# Patient Record
Sex: Female | Born: 1965 | Hispanic: No | Marital: Single | Smoking: Never smoker
Health system: Southern US, Community
[De-identification: ages and names within clinical notes are randomized; demographics above are authoritative.]

## PROBLEM LIST (undated history)

## (undated) DIAGNOSIS — G4730 Sleep apnea, unspecified: Secondary | ICD-10-CM

## (undated) DIAGNOSIS — G43909 Migraine, unspecified, not intractable, without status migrainosus: Secondary | ICD-10-CM

## (undated) DIAGNOSIS — N3281 Overactive bladder: Secondary | ICD-10-CM

## (undated) DIAGNOSIS — J449 Chronic obstructive pulmonary disease, unspecified: Secondary | ICD-10-CM

## (undated) DIAGNOSIS — J45909 Unspecified asthma, uncomplicated: Secondary | ICD-10-CM

## (undated) HISTORY — DX: Overactive bladder: N32.81

## (undated) HISTORY — DX: Morbid (severe) obesity due to excess calories: E66.01

## (undated) HISTORY — PX: ABDOMINAL HYSTERECTOMY: SHX81

---

## 2009-06-10 ENCOUNTER — Inpatient Hospital Stay: Payer: Self-pay | Admitting: Internal Medicine

## 2009-07-07 ENCOUNTER — Inpatient Hospital Stay: Payer: Self-pay | Admitting: Internal Medicine

## 2010-01-23 ENCOUNTER — Emergency Department (HOSPITAL_COMMUNITY): Admission: EM | Admit: 2010-01-23 | Discharge: 2010-01-23 | Payer: Self-pay | Admitting: Emergency Medicine

## 2011-08-02 ENCOUNTER — Emergency Department: Payer: Self-pay | Admitting: *Deleted

## 2012-02-15 ENCOUNTER — Emergency Department: Payer: Self-pay | Admitting: Emergency Medicine

## 2012-02-15 LAB — URINALYSIS, COMPLETE
Bilirubin,UR: NEGATIVE
Blood: NEGATIVE
Glucose,UR: NEGATIVE mg/dL (ref 0–75)
Ketone: NEGATIVE
Nitrite: NEGATIVE
Ph: 6 (ref 4.5–8.0)
RBC,UR: 1 /HPF (ref 0–5)
Specific Gravity: 1.018 (ref 1.003–1.030)
Squamous Epithelial: 11

## 2013-03-11 ENCOUNTER — Emergency Department: Payer: Self-pay | Admitting: Emergency Medicine

## 2013-03-11 LAB — URINALYSIS, COMPLETE
Bilirubin,UR: NEGATIVE
Glucose,UR: NEGATIVE mg/dL (ref 0–75)
Ketone: NEGATIVE
Ph: 6 (ref 4.5–8.0)
RBC,UR: 20 /HPF (ref 0–5)
Specific Gravity: 1.018 (ref 1.003–1.030)
WBC UR: 272 /HPF (ref 0–5)

## 2013-04-02 ENCOUNTER — Emergency Department: Payer: Self-pay

## 2013-04-02 LAB — COMPREHENSIVE METABOLIC PANEL
Alkaline Phosphatase: 83 U/L (ref 50–136)
Bilirubin,Total: 1.3 mg/dL — ABNORMAL HIGH (ref 0.2–1.0)
Calcium, Total: 8.7 mg/dL (ref 8.5–10.1)
Chloride: 106 mmol/L (ref 98–107)
EGFR (African American): >60
Glucose: 98 mg/dL (ref 65–99)
Osmolality: 274 (ref 275–301)
Potassium: 3.7 mmol/L (ref 3.5–5.1)
SGOT(AST): 30 U/L (ref 15–37)
SGPT (ALT): 37 U/L (ref 12–78)
Total Protein: 7.9 g/dL (ref 6.4–8.2)

## 2013-04-02 LAB — TROPONIN I: Troponin-I: <0.02 ng/mL

## 2013-04-02 LAB — CBC
HGB: 14.9 g/dL (ref 12.0–16.0)
MCH: 29 pg (ref 26.0–34.0)
MCHC: 33.4 g/dL (ref 32.0–36.0)
MCV: 87 fL (ref 80–100)
Platelet: 214 10*3/uL (ref 150–440)
RBC: 5.13 10*6/uL (ref 3.80–5.20)
RDW: 14.7 % — ABNORMAL HIGH (ref 11.5–14.5)
WBC: 10.9 10*3/uL (ref 3.6–11.0)

## 2013-05-16 ENCOUNTER — Emergency Department: Payer: Self-pay | Admitting: Emergency Medicine

## 2013-05-16 LAB — URIC ACID: Uric Acid: 4.7 mg/dL (ref 2.6–6.0)

## 2013-05-16 LAB — CBC WITH DIFFERENTIAL/PLATELET
Basophil #: 0.1 10*3/uL (ref 0.0–0.1)
Eosinophil #: 0.5 10*3/uL (ref 0.0–0.7)
Eosinophil %: 4.5 %
HCT: 44 % (ref 35.0–47.0)
HGB: 14.1 g/dL (ref 12.0–16.0)
Lymphocyte #: 2.9 10*3/uL (ref 1.0–3.6)
Lymphocyte %: 28.1 %
MCH: 28 pg (ref 26.0–34.0)
Monocyte #: 0.5 x10 3/mm (ref 0.2–0.9)
Neutrophil #: 6.2 10*3/uL (ref 1.4–6.5)
Neutrophil %: 61.1 %
RDW: 14.3 % (ref 11.5–14.5)
WBC: 10.2 10*3/uL (ref 3.6–11.0)

## 2013-05-16 LAB — BASIC METABOLIC PANEL
Anion Gap: 9 (ref 7–16)
Calcium, Total: 8.8 mg/dL (ref 8.5–10.1)
Creatinine: 0.59 mg/dL — ABNORMAL LOW (ref 0.60–1.30)
EGFR (African American): >60
Glucose: 98 mg/dL (ref 65–99)
Osmolality: 274 (ref 275–301)

## 2014-06-22 ENCOUNTER — Emergency Department: Payer: Self-pay | Admitting: Emergency Medicine

## 2015-01-19 ENCOUNTER — Encounter: Payer: Self-pay | Admitting: Emergency Medicine

## 2015-01-19 ENCOUNTER — Emergency Department: Payer: Medicaid Other

## 2015-01-19 ENCOUNTER — Emergency Department
Admission: EM | Admit: 2015-01-19 | Discharge: 2015-01-19 | Disposition: A | Discharge status: Home / Self Care | Payer: Medicaid Other | Attending: Emergency Medicine | Admitting: Emergency Medicine

## 2015-01-19 DIAGNOSIS — Z880 Allergy status to penicillin: Secondary | ICD-10-CM | POA: Insufficient documentation

## 2015-01-19 DIAGNOSIS — S6391XA Sprain of unspecified part of right wrist and hand, initial encounter: Secondary | ICD-10-CM

## 2015-01-19 DIAGNOSIS — Y9389 Activity, other specified: Secondary | ICD-10-CM | POA: Insufficient documentation

## 2015-01-19 DIAGNOSIS — Y92092 Bedroom in other non-institutional residence as the place of occurrence of the external cause: Secondary | ICD-10-CM | POA: Diagnosis not present

## 2015-01-19 DIAGNOSIS — Y998 Other external cause status: Secondary | ICD-10-CM | POA: Diagnosis not present

## 2015-01-19 DIAGNOSIS — S6991XA Unspecified injury of right wrist, hand and finger(s), initial encounter: Secondary | ICD-10-CM | POA: Diagnosis present

## 2015-01-19 DIAGNOSIS — W06XXXA Fall from bed, initial encounter: Secondary | ICD-10-CM | POA: Diagnosis not present

## 2015-01-19 HISTORY — DX: Sleep apnea, unspecified: G47.30

## 2015-01-19 HISTORY — DX: Chronic obstructive pulmonary disease, unspecified: J44.9

## 2015-01-19 MED ORDER — NAPROXEN 500 MG PO TABS: 500.0000 mg | ORAL_TABLET | Freq: Two times a day (BID) | ORAL | Status: AC

## 2015-01-19 MED ORDER — TRAMADOL HCL 50 MG PO TABS: 50.0000 mg | ORAL_TABLET | Freq: Four times a day (QID) | ORAL | Status: DC | PRN

## 2015-01-19 NOTE — ED Notes (Signed)
Discussed with the patient and all questions fully answered. ACE wrap placed to hand. Discharge instructions and prescriptions reviewed with patient.

## 2015-01-19 NOTE — Discharge Instructions (Signed)
No fracture is seen today. Follow up with orthopedics if symptoms persist. Return to the ER for symptoms that change or worsen.

## 2015-01-19 NOTE — ED Notes (Signed)
C/o right hand pain since last Saturday after falling out of bed, states pain had improved but started back today

## 2015-01-19 NOTE — ED Provider Notes (Signed)
[facility] Emergency Department Provider Note ____________________________________________  Time seen: Approximately 12:38 PM  I have reviewed the triage vital signs and the nursing notes.   HISTORY  Chief Complaint Hand Pain   HPI Sheri Carter is a 49 y.o. female who presents to the emergency department for evaluation of right hand pain. She states she fell out of bed 8 days ago and fell on her hand. She states it had been feeling better until today and it started to swell. No new injury.   Past Medical History  Diagnosis Date  . Sleep apnea   . COPD (chronic obstructive pulmonary disease)     There are no active problems to display for this patient.   Past Surgical History  Procedure Laterality Date  . Abdominal hysterectomy      Current Outpatient Rx  Name  Route  Sig  Dispense  Refill  . naproxen (NAPROSYN) 500 MG tablet   Oral   Take 1 tablet (500 mg total) by mouth 2 (two) times daily with a meal.   60 tablet   2   . traMADol (ULTRAM) 50 MG tablet   Oral   Take 1 tablet (50 mg total) by mouth every 6 (six) hours as needed.   9 tablet   0     Allergies Penicillins  No family history on file.  Social History Social History  Substance Use Topics  . Smoking status: Never Smoker   . Smokeless tobacco: None  . Alcohol Use: No    Review of Systems Constitutional: No recent illness. Eyes: No visual changes. ENT: No sore throat. Cardiovascular: Denies chest pain or palpitations. Respiratory: Denies shortness of breath. Gastrointestinal: No abdominal pain.  Genitourinary: Negative for dysuria. Musculoskeletal: Pain in right hand. Skin: Negative for rash. Neurological: Negative for headaches, focal weakness or numbness. 10-point ROS otherwise negative.  ____________________________________________   PHYSICAL EXAM:  VITAL SIGNS: ED Triage Vitals  Enc Vitals Group     BP 01/19/15 1132 119/55 mmHg     Pulse Rate  01/19/15 1132 80     Resp 01/19/15 1132 18     Temp 01/19/15 1132 98.2 F (36.8 C)     Temp Source 01/19/15 1132 Oral     SpO2 01/19/15 1132 98 %     Weight 01/19/15 1132 335 lb (151.955 kg)     Height 01/19/15 1132  (1.702 m)     Head Cir --      Peak Flow --      Pain Score 01/19/15 1132 5     Pain Loc --      Pain Edu? --      Excl. in GC? --    Constitutional: Alert and oriented. Well appearing and in no acute distress. Eyes: Conjunctivae are normal. EOMI. Head: Atraumatic. Nose: No congestion/rhinnorhea. Neck: No stridor.  Respiratory: Normal respiratory effort.   Musculoskeletal: mild swelling noted over the dorsal aspect of the right hand. Pain with flexion of the fingers.  Neurologic:  Normal speech and language. No gross focal neurologic deficits are appreciated. Speech is normal. No gait instability. Skin:  Skin is warm, dry and intact. Atraumatic. Psychiatric: Mood and affect are normal. Speech and behavior are normal.  ____________________________________________   LABS (all labs ordered are listed, but only abnormal results are displayed)  Labs Reviewed - No data to display ____________________________________________  RADIOLOGY  Right hand negative for acute bony injury.  I, Kem Boroughs, personally viewed and evaluated these images  as part of my medical decision making.   ____________________________________________   PROCEDURES  Procedure(s) performed: ACE bandage applied to the right hand. Neurovascularly intact after application.   ____________________________________________   INITIAL IMPRESSION / ASSESSMENT AND PLAN / ED COURSE  Pertinent labs & imaging results that were available during my care of the patient were reviewed by me and considered in my medical decision making (see chart for details).  Patient was advised to follow-up with orthopedics for symptoms that are not improving over the week. She was advised to return to the  emergency department for symptoms that change or worsen if she is unable schedule an appointment. ____________________________________________   FINAL CLINICAL IMPRESSION(S) / ED DIAGNOSES  Final diagnoses:  Hand sprain, right, initial encounter      Chinita Pester, FNP 01/19/15 1456  Emily Filbert, MD 01/19/15 [phone]

## 2015-05-27 ENCOUNTER — Encounter: Payer: Self-pay | Admitting: Emergency Medicine

## 2015-05-27 ENCOUNTER — Emergency Department
Admission: EM | Admit: 2015-05-27 | Discharge: 2015-05-27 | Disposition: A | Discharge status: Home / Self Care | Payer: Medicaid Other | Attending: Emergency Medicine | Admitting: Emergency Medicine

## 2015-05-27 DIAGNOSIS — Z880 Allergy status to penicillin: Secondary | ICD-10-CM | POA: Diagnosis not present

## 2015-05-27 DIAGNOSIS — G43709 Chronic migraine without aura, not intractable, without status migrainosus: Secondary | ICD-10-CM | POA: Diagnosis not present

## 2015-05-27 DIAGNOSIS — R51 Headache: Secondary | ICD-10-CM | POA: Diagnosis present

## 2015-05-27 DIAGNOSIS — Z791 Long term (current) use of non-steroidal anti-inflammatories (NSAID): Secondary | ICD-10-CM | POA: Diagnosis not present

## 2015-05-27 HISTORY — DX: Migraine, unspecified, not intractable, without status migrainosus: G43.909

## 2015-05-27 MED ORDER — DIPHENHYDRAMINE HCL 50 MG/ML IJ SOLN
50.0000 mg | Freq: Once | INTRAMUSCULAR | Status: AC
  Administered 2015-05-27: 50 mg via INTRAMUSCULAR
  Filled 2015-05-27: qty 1

## 2015-05-27 MED ORDER — SUMATRIPTAN SUCCINATE 50 MG PO TABS: 100.0000 mg | ORAL_TABLET | Freq: Once | ORAL | Status: AC | PRN

## 2015-05-27 MED ORDER — KETOROLAC TROMETHAMINE 60 MG/2ML IM SOLN
60.0000 mg | Freq: Once | INTRAMUSCULAR | Status: AC
  Administered 2015-05-27: 60 mg via INTRAMUSCULAR
  Filled 2015-05-27: qty 2

## 2015-05-27 MED ORDER — PROMETHAZINE HCL 25 MG/ML IJ SOLN
25.0000 mg | Freq: Once | INTRAMUSCULAR | Status: AC
  Administered 2015-05-27: 25 mg via INTRAMUSCULAR
  Filled 2015-05-27: qty 1

## 2015-05-27 NOTE — ED Notes (Signed)
Pt to ed with c/o headache x several weeks,  Pt states she was seen by dr Glenis Smokerneimeyer and started on fioricet without relief

## 2015-05-27 NOTE — Discharge Instructions (Signed)
Recurrent Migraine Headache A migraine headache is an intense, throbbing pain on one or both sides of your head. Recurrent migraines keep coming back. A migraine can last for 30 minutes to several hours. CAUSES  The exact cause of a migraine headache is not always known. However, a migraine may be caused when nerves in the brain become irritated and release chemicals that cause inflammation. This causes pain. Certain things may also trigger migraines, such as:   Alcohol.  Smoking.  Stress.  Menstruation.  Aged cheeses.  Foods or drinks that contain nitrates, glutamate, aspartame, or tyramine.  Lack of sleep.  Chocolate.  Caffeine.  Hunger.  Physical exertion.  Fatigue.  Medicines used to treat chest pain (nitroglycerine), birth control pills, estrogen, and some blood pressure medicines. SYMPTOMS   Pain on one or both sides of your head.  Pulsating or throbbing pain.  Severe pain that prevents daily activities.  Pain that is aggravated by any physical activity.  Nausea, vomiting, or both.  Dizziness.  Pain with exposure to bright lights, loud noises, or activity.  General sensitivity to bright lights, loud noises, or smells. Before you get a migraine, you may get warning signs that a migraine is coming (aura). An aura may include:  Seeing flashing lights.  Seeing bright spots, halos, or zigzag lines.  Having tunnel vision or blurred vision.  Having feelings of numbness or tingling.  Having trouble talking.  Having muscle weakness. DIAGNOSIS  A recurrent migraine headache is often diagnosed based on:  Symptoms.  Physical examination.  A CT scan or MRI of your head. These imaging tests cannot diagnose migraines but can help rule out other causes of headaches.  TREATMENT  Medicines may be given for pain and nausea. Medicines can also be given to help prevent recurrent migraines. HOME CARE INSTRUCTIONS  Only take over-the-counter or prescription  medicines for pain or discomfort as directed by your health care provider. The use of long-term narcotics is not recommended.  Lie down in a dark, quiet room when you have a migraine.  Keep a journal to find out what may trigger your migraine headaches. For example, write down:  What you eat and drink.  How much sleep you get.  Any change to your diet or medicines.  Limit alcohol consumption.  Quit smoking if you smoke.  Get 7-9 hours of sleep, or as recommended by your health care provider.  Limit stress.  Keep lights dim if bright lights bother you and make your migraines worse. SEEK MEDICAL CARE IF:   You do not get relief from the medicines given to you.  You have a recurrence of pain.  You have a fever. SEEK IMMEDIATE MEDICAL CARE IF:  Your migraine becomes severe.  You have a stiff neck.  You have loss of vision.  You have muscular weakness or loss of muscle control.  You start losing your balance or have trouble walking.  You feel faint or pass out.  You have severe symptoms that are different from your first symptoms. MAKE SURE YOU:   Understand these instructions.  Will watch your condition.  Will get help right away if you are not doing well or get worse.   This information is not intended to replace advice given to you by your health care provider. Make sure you discuss any questions you have with your health care provider.   Document Released: 02/16/2001 Document Revised: 06/14/2014 Document Reviewed: 01/29/2013 Elsevier Interactive Patient Education 2016 [employer]  

## 2015-05-27 NOTE — ED Provider Notes (Signed)
[facility] Emergency Department Provider Note  ____________________________________________  Time seen: Approximately 3:35 PM  I have reviewed the triage vital signs and the nursing notes.   HISTORY  Chief Complaint Headache    HPI Sheri PanningJulie Ann Valiente is a 49 y.o. female who presents to the emergency department for complaint of a migraine headache that has lasted for "2-3 weeks." Per the patient she has seen her primary care and received Fioricet. She is started to take this medication with limited relief. Patient states that symptoms of migraine are consistent with previous migraines with the exception that this is lasting "longer than normal." Patient is mildly photophobic but states this is typical with her migraines. Patient denies any neck pain, fevers or chills, chest pain, shortness of breath, pain, nausea vomiting, numbness or tingling.   Past Medical History  Diagnosis Date  . Sleep apnea   . COPD (chronic obstructive pulmonary disease) (HCC)   . Migraines     There are no active problems to display for this patient.   Past Surgical History  Procedure Laterality Date  . Abdominal hysterectomy      Current Outpatient Rx  Name  Route  Sig  Dispense  Refill  . naproxen (NAPROSYN) 500 MG tablet   Oral   Take 1 tablet (500 mg total) by mouth 2 (two) times daily with a meal.   60 tablet   2   . SUMAtriptan (IMITREX) 50 MG tablet   Oral   Take 2 tablets (100 mg total) by mouth once as needed for migraine. May repeat in 2 hours if headache persists or recurs. Do not exceed 200 mg per day.   30 tablet   0   . traMADol (ULTRAM) 50 MG tablet   Oral   Take 1 tablet (50 mg total) by mouth every 6 (six) hours as needed.   9 tablet   0     Allergies Penicillins  History reviewed. No pertinent family history.  Social History Social History  Substance Use Topics  . Smoking status: Never Smoker   . Smokeless tobacco: None  . Alcohol Use:  No    Review of Systems Constitutional: No fever/chills Eyes: No visual changes. ENT: No sore throat. Cardiovascular: Denies chest pain. Respiratory: Denies shortness of breath. Gastrointestinal: No abdominal pain.  No nausea, no vomiting.  No diarrhea.  No constipation. Genitourinary: Negative for dysuria. Musculoskeletal: Negative for back pain. Skin: Negative for rash. Neurological: She endorses a headache but denies focal weakness or numbness.  10-point ROS otherwise negative.  ____________________________________________   PHYSICAL EXAM:  VITAL SIGNS: ED Triage Vitals  Enc Vitals Group     BP 05/27/15 1422 158/79 mmHg     Pulse Rate 05/27/15 1422 85     Resp 05/27/15 1422 20     Temp 05/27/15 1422 98.2 F (36.8 C)     Temp Source 05/27/15 1422 Oral     SpO2 05/27/15 1422 98 %     Weight 05/27/15 1422 290 lb (131.543 kg)     Height 05/27/15 1422 5\' 7"  (1.702 m)     Head Cir --      Peak Flow --      Pain Score 05/27/15 1423 10     Pain Loc --      Pain Edu? --      Excl. in GC? --     Constitutional: Alert and oriented. Well appearing and in no acute distress. Eyes: Conjunctivae are normal. PERRL. EOMI.  Head: Atraumatic. Nose: No congestion/rhinnorhea. Mouth/Throat: Mucous membranes are moist.  Oropharynx non-erythematous. Neck: No stridor.   Cardiovascular: Normal rate, regular rhythm. Grossly normal heart sounds.  Good peripheral circulation. Respiratory: Normal respiratory effort.  No retractions. Lungs CTAB. Gastrointestinal: Soft and nontender. No distention. No abdominal bruits. No CVA tenderness. Musculoskeletal: No lower extremity tenderness nor edema.  No joint effusions. Neurologic:  Normal speech and language. No gross focal neurologic deficits are appreciated. No gait instability. Cranial nerves II through XII grossly intact. Skin:  Skin is warm, dry and intact. No rash noted. Psychiatric: Mood and affect are normal. Speech and behavior are  normal.  ____________________________________________   LABS (all labs ordered are listed, but only abnormal results are displayed)  Labs Reviewed - No data to display ____________________________________________  EKG   ____________________________________________  RADIOLOGY   ____________________________________________   PROCEDURES  Procedure(s) performed: None  Critical Care performed: No  ____________________________________________   INITIAL IMPRESSION / ASSESSMENT AND PLAN / ED COURSE  Pertinent labs & imaging results that were available during my care of the patient were reviewed by me and considered in my medical decision making (see chart for details).  Patient presents emergency Department with a complaint of a migraine headache. Patient states that she has been recently placed on Fioricet without relief. She states that symptoms are consistent with previous migraines with no change from baseline except and length of duration. Patient is given IM Toradol, diphenhydramine, Phenergan with good effect in the emergency department. Patient will be discharged home with a prescription of Imitrex for further relief at home. Patient verbalizes understanding of diagnosis and treatment plan and verbalizes compliance with same. Patient verbalizes that she will return to the emergency department for any worsening or change in her symptoms.   New Prescriptions   SUMATRIPTAN (IMITREX) 50 MG TABLET    Take 2 tablets (100 mg total) by mouth once as needed for migraine. May repeat in 2 hours if headache persists or recurs. Do not exceed 200 mg per day.    ____________________________________________   FINAL CLINICAL IMPRESSION(S) / ED DIAGNOSES  Final diagnoses:  Chronic migraine without aura without status migrainosus, not intractable      Racheal Patches, PA-C 05/27/15 1615  Governor Rooks, MD 05/31/15 [id number]

## 2015-09-02 ENCOUNTER — Ambulatory Visit
Admission: RE | Admit: 2015-09-02 | Discharge: 2015-09-02 | Disposition: A | Discharge status: Home / Self Care | Payer: Medicaid Other | Source: Ambulatory Visit | Attending: Family Medicine | Admitting: Family Medicine

## 2015-09-02 ENCOUNTER — Other Ambulatory Visit: Payer: Self-pay | Admitting: Family Medicine

## 2015-09-02 DIAGNOSIS — R918 Other nonspecific abnormal finding of lung field: Secondary | ICD-10-CM | POA: Insufficient documentation

## 2015-09-02 DIAGNOSIS — R05 Cough: Secondary | ICD-10-CM

## 2015-09-02 DIAGNOSIS — R059 Cough, unspecified: Secondary | ICD-10-CM

## 2015-09-24 ENCOUNTER — Encounter: Payer: Self-pay | Admitting: Obstetrics and Gynecology

## 2015-09-24 ENCOUNTER — Ambulatory Visit (INDEPENDENT_AMBULATORY_CARE_PROVIDER_SITE_OTHER): Payer: Medicaid Other | Admitting: Obstetrics and Gynecology

## 2015-09-24 VITALS — BP 119/76 | HR 90 | Ht 67.0 in | Wt 327.4 lb

## 2015-09-24 DIAGNOSIS — N816 Rectocele: Secondary | ICD-10-CM

## 2015-09-24 DIAGNOSIS — N8110 Cystocele, unspecified: Secondary | ICD-10-CM | POA: Diagnosis not present

## 2015-09-24 DIAGNOSIS — N3945 Continuous leakage: Secondary | ICD-10-CM | POA: Diagnosis not present

## 2015-09-24 DIAGNOSIS — N3281 Overactive bladder: Secondary | ICD-10-CM

## 2015-09-24 LAB — POCT URINALYSIS DIPSTICK
BILIRUBIN UA: NEGATIVE
Blood, UA: NEGATIVE
Glucose, UA: NEGATIVE
KETONES UA: NEGATIVE
LEUKOCYTES UA: NEGATIVE
Nitrite, UA: NEGATIVE
PH UA: 6.5
Protein, UA: NEGATIVE
Spec Grav, UA: 1.015
Urobilinogen, UA: NEGATIVE

## 2015-09-24 MED ORDER — TOLTERODINE TARTRATE ER 4 MG PO CP24: 4.0000 mg | ORAL_CAPSULE | Freq: Every day | ORAL | Status: DC

## 2015-09-24 NOTE — Patient Instructions (Signed)
Overactive Bladder, Adult Overactive bladder is a group of urinary symptoms. With overactive bladder, you may suddenly feel the need to pass urine (urinate) right away. After feeling this sudden urge, you might also leak urine if you cannot get to the bathroom fast enough (urinary incontinence). These symptoms might interfere with your daily work or social activities. Overactive bladder symptoms may also wake you up at night. Overactive bladder affects the nerve signals between your bladder and your brain. Your bladder may get the signal to empty before it is full. Very sensitive muscles can also make your bladder squeeze too soon. CAUSES Many things can cause an overactive bladder. Possible causes include:  Urinary tract infection.  Infection of nearby tissues, such as the prostate.  Prostate enlargement.  Being pregnant with twins or more (multiples).  Surgery on the uterus or urethra.  Bladder stones, inflammation, or tumors.  Drinking too much caffeine or alcohol.  Certain medicines, especially those that you take to help your body get rid of extra fluid (diuretics) by increasing urine production.  Muscle or nerve weakness, especially from:  A spinal cord injury.  Stroke.  Multiple sclerosis.  Parkinson disease.  Diabetes. This can cause a high urine volume that fills the bladder so quickly that the normal urge to urinate is triggered very strongly.  Constipation. A buildup of too much stool can put pressure on your bladder. RISK FACTORS You may be at greater risk for overactive bladder if you:  Are an older adult.  Smoke.  Are going through menopause.  Have prostate problems.  Have a neurological disease, such as stroke, dementia, Parkinson disease, or multiple sclerosis (MS).  Eat or drink things that irritate the bladder. These include alcohol, spicy food, and caffeine.  Are overweight or obese. SIGNS AND SYMPTOMS  The signs and symptoms of an overactive  bladder include:  Sudden, strong urges to urinate.  Leaking urine.  Urinating eight or more times per day.  Waking up to urinate two or more times per night. DIAGNOSIS Your health care provider may suspect overactive bladder based on your symptoms. The health care provider will do a physical exam and take your medical history. Blood or urine tests may also be done. For example, you might need to have a bladder function test to check how well you can hold your urine. You might also need to see a health care provider who specializes in the urinary tract (urologist). TREATMENT Treatment for overactive bladder depends on the cause of your condition and whether it is mild or severe. Certain treatments can be done in your health care provider's office or clinic. You can also make lifestyle changes at home. Options include: Behavioral Treatments  Biofeedback. A specialist uses sensors to help you become aware of your body's signals.  Keeping a daily log of when you need to urinate and what happens after the urge. This may help you manage your condition.  Bladder training. This helps you learn to control the urge to urinate by following a schedule that directs you to urinate at regular intervals (timed voiding). At first, you might have to wait a few minutes after feeling the urge. In time, you should be able to schedule bathroom visits an hour or more apart.  Kegel exercises. These are exercises to strengthen the pelvic floor muscles, which support the bladder. Toning these muscles can help you control urination, even if your bladder muscles are overactive. A specialist will teach you how to do these exercises correctly. They   require daily practice.  Weight loss. If you are obese or overweight, losing weight might relieve your symptoms of overactive bladder. Talk to your health care provider about losing weight and whether there is a specific program or method that would work best for you.  Diet  change. This might help if constipation is making your overactive bladder worse. Your health care provider or a dietitian can explain ways to change what you eat to ease constipation. You might also need to consume less alcohol and caffeine or drink other fluids at different times of the day.  Stopping smoking.  Wearing pads to absorb leakage while you wait for other treatments to take effect. Physical Treatments  Electrical stimulation. Electrodes send gentle pulses of electricity to strengthen the nerves or muscles that help to control the bladder. Sometimes, the electrodes are placed outside of the body. In other cases, they might be placed inside the body (implanted). This treatment can take several months to have an effect.  Supportive devices. Women may need a plastic device that fits into the vagina and supports the bladder (pessary). Medicines Several medicines can help treat overactive bladder and are usually used along with other treatments. Some are injected into the muscles involved in urination. Others come in pill form. Your health care provider may prescribe:  Antispasmodics. These medicines block the signals that the nerves send to the bladder. This keeps the bladder from releasing urine at the wrong time.  Tricyclic antidepressants. These types of antidepressants also relax bladder muscles. Surgery  You may have a device implanted to help manage the nerve signals that indicate when you need to urinate.  You may have surgery to implant electrodes for electrical stimulation.  Sometimes, very severe cases of overactive bladder require surgery to change the shape of the bladder. HOME CARE INSTRUCTIONS   Take medicines only as directed by your health care provider.  Use any implants or a pessary as directed by your health care provider.  Make any diet or lifestyle changes that are recommended by your health care provider. These might include:  Drinking less fluid or  drinking at different times of the day. If you need to urinate often during the night, you may need to stop drinking fluids early in the evening.  Cutting down on caffeine or alcohol. Both can make an overactive bladder worse. Caffeine is found in coffee, tea, and sodas.  Doing Kegel exercises to strengthen muscles.  Losing weight if you need to.  Eating a healthy and balanced diet to prevent constipation.  Keep a journal or log to track how much and when you drink and also when you feel the need to urinate. This will help your health care provider to monitor your condition. SEEK MEDICAL CARE IF:  Your symptoms do not get better after treatment.  Your pain and discomfort are getting worse.  You have more frequent urges to urinate.  You have a fever. SEEK IMMEDIATE MEDICAL CARE IF: You are not able to control your bladder at all.   This information is not intended to replace advice given to you by your health care provider. Make sure you discuss any questions you have with your health care provider.   Document Released: 03/20/2009 Document Revised: 06/14/2014 Document Reviewed: 10/17/2013 Elsevier Interactive Patient Education 2016 [employer]  

## 2015-09-24 NOTE — Progress Notes (Signed)
GYNECOLOGY CLINIC PROGRESS NOTE  Subjective:     Sheri Carter is a 50 y.o. [phone] female who was referred by [facility] (Dr. Franco Nonesheryl Lindley) for evaluation of urinary incontinence. This has been present for 1 year, but has worsened over the past 4 years. She leaks urine without any exacerbating factors.  States that she does have urgency symptoms, and has to empty her bladder every 15-30 minutes.  She notes that if she does not go right at time of sensation, she leaks almost half of her urine contents.  Patient notes that she also leaks urine even without an urge or sensation to go, does not depend on activity.  States that she now goes through 9654 urinary pads in ~ 2 days, and also uses chucks underneath her and goes throuhg 1 pack every 6 days.  Patient does note h/o UTI's in the past, but has not had one in several years. Currently denies dysuria or hematuria. Factors associated with symptoms include: none known. Evaluation to date includes none. Treatment to date includes Vesicare, which patient notes did not work for her (noted no change in symptoms even on max dose).    Past Medical History  Diagnosis Date  . Sleep apnea   . COPD (chronic obstructive pulmonary disease) (HCC)   . Migraines   . Overactive bladder     History reviewed. No pertinent family history.   Past Surgical History  Procedure Laterality Date  . Abdominal hysterectomy      Social History   Social History  . Marital Status: Single    Spouse Name: N/A  . Number of Children: N/A  . Years of Education: N/A   Occupational History  . Not on file.   Social History Main Topics  . Smoking status: Never Smoker   . Smokeless tobacco: Not on file  . Alcohol Use: No  . Drug Use: No  . Sexual Activity: No   Other Topics Concern  . Not on file   Social History Narrative    Current Outpatient Prescriptions on File Prior to Visit  Medication Sig Dispense Refill  . naproxen (NAPROSYN) 500  MG tablet Take 1 tablet (500 mg total) by mouth 2 (two) times daily with a meal. 60 tablet 2  . SUMAtriptan (IMITREX) 50 MG tablet Take 2 tablets (100 mg total) by mouth once as needed for migraine. May repeat in 2 hours if headache persists or recurs. Do not exceed 200 mg per day. 30 tablet 0  . traMADol (ULTRAM) 50 MG tablet Take 1 tablet (50 mg total) by mouth every 6 (six) hours as needed. 9 tablet 0   No current facility-administered medications on file prior to visit.    Allergies  Allergen Reactions  . Penicillins Itching    Review of Systems Pertinent items noted in HPI and remainder of comprehensive ROS otherwise negative.    Objective:    BP 119/76 mmHg  Pulse 90  Ht 5\' 7"  (1.702 m)  Wt 327 lb 6.4 oz (148.508 kg)  BMI 51.27 kg/m2 General appearance: alert, no distress and morbidly obese Abdomen: soft, non-tender; bowel sounds normal; no masses,  no organomegaly Pelvic: external genitalia normal, no adnexal masses or tenderness, no cervical motion tenderness, rectovaginal septum normal, uterus surgically absent and vagina normal without discharge.  Grade 2 cystocele, Grade 1 rectocele present. Weak pelvic floor muscles noted.  Increased UV angle with Q-tip test.  Extremities: extremities normal, atraumatic, no cyanosis or edema Skin: Skin color,  texture, turgor normal. No rashes or lesions Neurologic: Grossly normal    Lab Review Results for orders placed or performed in visit on 09/24/15  POCT urinalysis dipstick  Result Value Ref Range   Color, UA dark yellow    Clarity, UA clear    Glucose, UA neg    Bilirubin, UA neg    Ketones, UA neg    Spec Grav, UA 1.015    Blood, UA neg    pH, UA 6.5    Protein, UA neg    Urobilinogen, UA negative    Nitrite, UA neg    Leukocytes, UA Negative Negative     Assessment:    Incontinence.  Severity = severe,  Overactive bladder Cystocele with rectocele Morbid obesity (Class III)  Plan:    The causes of  incontinence and plan for evaluation and treatment were discussed. Appropriate educational materials were distributed. Discussed Kegel exercised in detail. Prescribed Detrol LA   Discussed referral to pelvic floor physical therapy as pelvic muscles weak.  Discussed increased BMI as a risk factor for incontinence Plan to return in 2 weeks for pessary fitting.    Hildred Laser, MD [facility]

## 2015-09-25 ENCOUNTER — Encounter: Payer: Self-pay | Admitting: Obstetrics and Gynecology

## 2015-09-25 ENCOUNTER — Encounter: Payer: Self-pay | Admitting: *Deleted

## 2015-09-25 DIAGNOSIS — N8110 Cystocele, unspecified: Secondary | ICD-10-CM | POA: Insufficient documentation

## 2015-09-25 DIAGNOSIS — N816 Rectocele: Secondary | ICD-10-CM

## 2015-09-25 DIAGNOSIS — N3945 Continuous leakage: Secondary | ICD-10-CM | POA: Insufficient documentation

## 2015-09-25 HISTORY — DX: Morbid (severe) obesity due to excess calories: E66.01

## 2015-09-29 ENCOUNTER — Telehealth: Payer: Self-pay

## 2015-09-29 DIAGNOSIS — N3945 Continuous leakage: Secondary | ICD-10-CM

## 2015-09-29 MED ORDER — FESOTERODINE FUMARATE ER 4 MG PO TB24: 4.0000 mg | ORAL_TABLET | Freq: Every day | ORAL | Status: DC

## 2015-09-29 NOTE — Telephone Encounter (Signed)
Called pt no answer. New RX sent in for medication that medicaid will cover per Dr. Valentino Saxonherry.

## 2015-10-15 ENCOUNTER — Encounter: Payer: Self-pay | Admitting: Obstetrics and Gynecology

## 2015-10-15 ENCOUNTER — Ambulatory Visit (INDEPENDENT_AMBULATORY_CARE_PROVIDER_SITE_OTHER): Payer: Medicaid Other | Admitting: Obstetrics and Gynecology

## 2015-10-15 ENCOUNTER — Encounter: Payer: Medicaid Other | Admitting: Obstetrics and Gynecology

## 2015-10-15 VITALS — BP 123/83 | HR 79 | Wt 322.2 lb

## 2015-10-15 DIAGNOSIS — N3281 Overactive bladder: Secondary | ICD-10-CM | POA: Diagnosis not present

## 2015-10-15 DIAGNOSIS — N3945 Continuous leakage: Secondary | ICD-10-CM | POA: Diagnosis not present

## 2015-10-15 DIAGNOSIS — N816 Rectocele: Secondary | ICD-10-CM | POA: Diagnosis not present

## 2015-10-15 DIAGNOSIS — N8110 Cystocele, unspecified: Secondary | ICD-10-CM | POA: Diagnosis not present

## 2015-10-15 NOTE — Progress Notes (Signed)
    GYNECOLOGY PROGRESS NOTE  Subjective:    Patient ID: Sheri Carter, female    DOB: 05/11/1966, 50 y.o.   MRN: [id number]  HPI  Patient is a 50 y.o. [id number] female who presents for pessary fitting.  Symptoms include stress urinary incontinence, overactive bladder with leakage, cystocele with rectocele, morbid obesity (Class III).  Prescribed Detrol LA at last visit to help with OAB symptoms.  Patient notes that she was unable to get medication due to insurance not covering.  Was switched to Toviaz, patient notes that she has not yet initiated yet.   The following portions of the patient's history were reviewed and updated as appropriate: allergies, current medications, past family history, past medical history, past social history, past surgical history and problem list.  Review of Systems Pertinent items noted in HPI and remainder of comprehensive ROS otherwise negative.   Objective:   Blood pressure 123/83, pulse 79, weight 322 lb 4 oz (146.172 kg). General appearance: alert, no distress and morbidly obese Abdomen: soft, non-tender; bowel sounds normal; no masses,  no organomegaly Pelvic:external genitalia normal, no adnexal masses or tenderness, no cervical motion tenderness, rectovaginal septum normal, uterus surgically absent and vagina normal without discharge. Grade 2 cystocele, Grade 1 rectocele present. Weak pelvic floor muscles noted. Increased UV angle with Q-tip test.  Extremities: extremities normal, atraumatic, no cyanosis or edema Neurologic: Grossly normal   Assessment:   Incontinence. Severity = severe, Overactive bladder Cystocele with rectocele Morbid obesity (Class III)  Plan:   Pessary fitting performed today.  Will order size 2 incontinence dish with knob.  Will contact patient once pessary arrives.  Recommended patient to take Toviaz as prescribed.    Hildred LaserAnika Julion Gatt, MD [facility]

## 2015-10-23 ENCOUNTER — Encounter: Payer: Medicaid Other | Admitting: Obstetrics and Gynecology

## 2015-11-26 ENCOUNTER — Encounter: Payer: Medicaid Other | Admitting: Obstetrics and Gynecology

## 2016-01-01 ENCOUNTER — Ambulatory Visit: Payer: Medicaid Other | Admitting: Obstetrics and Gynecology

## 2016-01-13 ENCOUNTER — Ambulatory Visit (INDEPENDENT_AMBULATORY_CARE_PROVIDER_SITE_OTHER): Payer: Medicaid Other | Admitting: Obstetrics and Gynecology

## 2016-01-13 ENCOUNTER — Encounter: Payer: Self-pay | Admitting: Obstetrics and Gynecology

## 2016-01-13 VITALS — BP 108/73 | HR 85 | Ht 67.0 in | Wt 328.8 lb

## 2016-01-13 DIAGNOSIS — N816 Rectocele: Secondary | ICD-10-CM | POA: Diagnosis not present

## 2016-01-13 DIAGNOSIS — N8110 Cystocele, unspecified: Secondary | ICD-10-CM | POA: Diagnosis not present

## 2016-01-13 DIAGNOSIS — N3281 Overactive bladder: Secondary | ICD-10-CM

## 2016-01-13 DIAGNOSIS — N3945 Continuous leakage: Secondary | ICD-10-CM

## 2016-01-13 NOTE — Progress Notes (Signed)
    GYNECOLOGY PROGRESS NOTE  Subjective:    Patient ID: Junie PanningJulie Ann Mccosh, female    DOB: 03/31/1966, 50 y.o.   MRN: [id number]  HPI  Patient is a 50 y.o. 903P0 female who presents for pessary insertion.  Patient with cystocele with rectocele, overactive bladder with urge incontinence and continuous leakage, and morbid obesity.  Patient notes she began taking Toviaz, however has not noted much improvement.   The following portions of the patient's history were reviewed and updated as appropriate: allergies, current medications, past family history, past medical history, past social history, past surgical history and problem list.  Review of Systems Pertinent items noted in HPI and remainder of comprehensive ROS otherwise negative.   Objective:   Blood pressure 108/73, pulse 85, height 5\' 7"  (1.702 m), weight (!) 328 lb 12.8 oz (149.1 kg). General appearance: alert and no distress Abdomen: soft, non-tender; bowel sounds normal; no masses,  no organomegaly Pelvic: external genitalia normal, no adnexal masses or tenderness, no cervical motion tenderness, rectovaginal septum normal, uterus surgically absent and vagina normal without discharge. Grade 2 cystocele, Grade 1 rectocele present. Weak pelvic floor muscles noted. Extremities: extremities normal, atraumatic, no cyanosis or edema Neurologic: Grossly normal   Assessment:   Incontinence. Severity = severe, Overactive bladder Cystocele with rectocele Morbid obesity (Class III)  Plan:   - Size 2 incontinence dish with knob inserted today.  To continue Toviaz with pessary in place.  Will discuss increasing dose at next visit if also no improvement.  Patient may also benefit from referral to pelvic floor physical therapy. Can discuss at next visit if no change in symptoms.   - strongly encouraged weight management.  Can discuss weight loss surgery at next visit.  - RTC in 2 weeks for pessary check.    Hildred LaserAnika Naomy Esham, MD [facility]

## 2016-01-29 ENCOUNTER — Ambulatory Visit (INDEPENDENT_AMBULATORY_CARE_PROVIDER_SITE_OTHER): Payer: Medicaid Other | Admitting: Obstetrics and Gynecology

## 2016-01-29 ENCOUNTER — Encounter: Payer: Self-pay | Admitting: Obstetrics and Gynecology

## 2016-01-29 VITALS — BP 112/72 | HR 70 | Ht 67.0 in | Wt 328.0 lb

## 2016-01-29 DIAGNOSIS — N3945 Continuous leakage: Secondary | ICD-10-CM

## 2016-01-29 DIAGNOSIS — N3946 Mixed incontinence: Secondary | ICD-10-CM | POA: Diagnosis not present

## 2016-01-29 DIAGNOSIS — Z6841 Body Mass Index (BMI) 40.0 and over, adult: Secondary | ICD-10-CM | POA: Diagnosis not present

## 2016-01-29 DIAGNOSIS — Z4689 Encounter for fitting and adjustment of other specified devices: Secondary | ICD-10-CM | POA: Diagnosis not present

## 2016-01-29 MED ORDER — OXYQUINOLONE SULFATE 0.025 % VA GEL: 0.0250 mg | VAGINAL | 3 refills | Status: DC

## 2016-01-29 MED ORDER — MIRABEGRON ER 25 MG PO TB24: 25.0000 mg | ORAL_TABLET | Freq: Every day | ORAL | Status: AC

## 2016-01-29 NOTE — Progress Notes (Signed)
    GYNECOLOGY PROGRESS NOTE  Subjective:    Patient ID: Sheri Carter, female    DOB: 04/14/1966, 50 y.o.   MRN: [id number]  HPI  Patient is a 50 y.o. [id number] morbidly obese female who presents for 2 week pessary check. Had size 2 incontinence dish with knob placed. Patient with a h/o urinary incontinence (mixed) and continuous leakage, Grade 2 cystocele, Grade 1 rectocele present.  Patient notes that she increased Toviaz without much change in OAB symptoms.  Also states that she did not notice any difference with the pessary.  It initially became difficult to urinate (like it was blocking), but was still also noting continuous leakage. She removed the pessary after only 3-4 days of use.   The following portions of the patient's history were reviewed and updated as appropriate: allergies, current medications, past family history, past medical history, past social history, past surgical history and problem list.  Review of Systems Pertinent items noted in HPI and remainder of comprehensive ROS otherwise negative.   Objective:   Blood pressure 112/72, pulse 70, height 5\' 7"  (1.702 m), weight (!) 328 lb (148.8 kg). Body mass index is 51.37 kg/m.  General appearance: alert and no distress, morbidly obese Abdomen: soft, non-tender; bowel sounds normal; no masses,  no organomegaly Pelvic: external genitalia normal, no adnexal masses or tenderness, no cervical motion tenderness, rectovaginal septum normal, uterus surgically absent and vagina normal without discharge. Grade 2 cystocele, Grade 1 rectocele present. Weak pelvic floor muscles noted. Extremities: extremities normal, atraumatic, no cyanosis or edema Neurologic: Grossly normal   Assessment:   Incontinence, mixed. Severity = severe, Continuous leakage Cystocele with rectocele Morbid obesity (Class III)  Plan:   - Discussion had that patient may benefit from referral to a Urogynecologist due to complexity of her incontinence.   May need further studies (including urodynamics).  May also need surgical intervention such as a sling that I do not perform. Will refer.  - Patient may also benefit from referral to pelvic floor physical therapy.  - Strongly encouraged again on weight management and weight loss. Patient may be a candidate for surgical intervention if desired.  Can discuss after gynecologic issues managed.  - Changed Toviaz to Myrbetric to see if this may offer any relief.    Sheri LaserAnika Brayten Komar, MD [facility]

## 2016-02-01 ENCOUNTER — Emergency Department
Admission: EM | Admit: 2016-02-01 | Discharge: 2016-02-01 | Disposition: A | Discharge status: Home / Self Care | Payer: Medicaid Other | Attending: Emergency Medicine | Admitting: Emergency Medicine

## 2016-02-01 ENCOUNTER — Encounter: Payer: Self-pay | Admitting: Emergency Medicine

## 2016-02-01 DIAGNOSIS — M545 Low back pain, unspecified: Secondary | ICD-10-CM

## 2016-02-01 DIAGNOSIS — M549 Dorsalgia, unspecified: Secondary | ICD-10-CM

## 2016-02-01 DIAGNOSIS — J449 Chronic obstructive pulmonary disease, unspecified: Secondary | ICD-10-CM | POA: Insufficient documentation

## 2016-02-01 DIAGNOSIS — G8929 Other chronic pain: Secondary | ICD-10-CM | POA: Diagnosis not present

## 2016-02-01 DIAGNOSIS — Z79899 Other long term (current) drug therapy: Secondary | ICD-10-CM | POA: Insufficient documentation

## 2016-02-01 LAB — URINALYSIS COMPLETE WITH MICROSCOPIC (ARMC ONLY)
Bacteria, UA: NONE SEEN
Bilirubin Urine: NEGATIVE
Glucose, UA: NEGATIVE mg/dL
Hgb urine dipstick: NEGATIVE
Ketones, ur: NEGATIVE mg/dL
Leukocytes, UA: NEGATIVE
Nitrite: NEGATIVE
Protein, ur: NEGATIVE mg/dL
Specific Gravity, Urine: 1.021 (ref 1.005–1.030)
pH: 5 (ref 5.0–8.0)

## 2016-02-01 MED ORDER — KETOROLAC TROMETHAMINE 60 MG/2ML IM SOLN
60.0000 mg | Freq: Once | INTRAMUSCULAR | Status: AC
  Administered 2016-02-01: 60 mg via INTRAMUSCULAR
  Filled 2016-02-01: qty 2

## 2016-02-01 MED ORDER — DIAZEPAM 5 MG PO TABS
10.0000 mg | ORAL_TABLET | Freq: Once | ORAL | Status: AC
  Administered 2016-02-01: 10 mg via ORAL
  Filled 2016-02-01: qty 2

## 2016-02-01 MED ORDER — KETOROLAC TROMETHAMINE 10 MG PO TABS: 10.0000 mg | ORAL_TABLET | Freq: Three times a day (TID) | ORAL | 0 refills | Status: DC

## 2016-02-01 MED ORDER — CYCLOBENZAPRINE HCL 5 MG PO TABS: 5.0000 mg | ORAL_TABLET | Freq: Three times a day (TID) | ORAL | 0 refills | Status: AC | PRN

## 2016-02-01 NOTE — ED Triage Notes (Signed)
Pt presents to ED with c/o lower back pain and pelvic pain. Pt states back pain started this morning. Pt c/o lower abdominal pain that she describes as throbbing. Pt denies injury at this time.

## 2016-02-01 NOTE — Discharge Instructions (Signed)
Take the prescription meds as directed. Follow-up with Dr. Lacie ScottsNiemeyer for continued symptoms.

## 2016-02-01 NOTE — ED Triage Notes (Signed)
Pt states she took oxycodone at approx 13 with no relief.

## 2016-02-06 ENCOUNTER — Encounter: Payer: Self-pay | Admitting: Physician Assistant

## 2016-02-06 NOTE — ED Provider Notes (Signed)
[facility] Emergency Department Provider Note ____________________________________________  Time seen: 2000  I have reviewed the triage vital signs and the nursing notes.  HISTORY  Chief Complaint  Back Pain and Pelvic Pain  HPI Sheri Carter is a 50 y.o. female presents to the ED for evaluation of low back pain as well as some referral into the lower abdomen that is chronic in nature. The patient describes a flare for years prior to arrival. She describes Dr. Lacie Scotts gives her prescription muscle relaxants for her pain management.She denies any recent accident, injury, fall, or trauma. Scribes the pain is throbbing in nature. She denies any distal paresthesias, incontinence, or foot drop. She describes pain is worsened by prolonged sitting. She is transitioning from the wheelchair to standing during the interview.  Past Medical History:  Diagnosis Date  . COPD (chronic obstructive pulmonary disease) (HCC)   . Migraines   . Obesity, morbid, BMI 50 or higher (HCC) 09/25/2015  . Overactive bladder   . Sleep apnea     Patient Active Problem List   Diagnosis Date Noted  . Cystocele with rectocele 09/25/2015  . Obesity, morbid, BMI 50 or higher (HCC) 09/25/2015  . Continuous leakage of urine 09/25/2015    Past Surgical History:  Procedure Laterality Date  . ABDOMINAL HYSTERECTOMY      Prior to Admission medications   Medication Sig Start Date End Date Taking? Authorizing Provider  albuterol (PROVENTIL HFA;VENTOLIN HFA) 108 (90 Base) MCG/ACT inhaler Inhale into the lungs every 6 (six) hours as needed for wheezing or shortness of breath.    Historical Provider, MD  albuterol (PROVENTIL) (2.5 MG/3ML) 0.083% nebulizer solution Take 2.5 mg by nebulization every 6 (six) hours as needed for wheezing or shortness of breath.    Historical Provider, MD  benzonatate (TESSALON) 200 MG capsule Take 200 mg by mouth 3 (three) times daily as needed for cough.     Historical Provider, MD  cyclobenzaprine (FLEXERIL) 5 MG tablet Take 1 tablet (5 mg total) by mouth 3 (three) times daily as needed for muscle spasms. 02/01/16   Jennaya Pogue V Bacon Jacora Hopkins, PA-C  ergocalciferol (VITAMIN D2) 50000 units capsule Take 50,000 Units by mouth once a week.    Historical Provider, MD  fluticasone (FLONASE) 50 MCG/ACT nasal spray Place into both nostrils daily.    Historical Provider, MD  Fluticasone-Salmeterol (ADVAIR) 250-50 MCG/DOSE AEPB Inhale 1 puff into the lungs 2 (two) times daily.    Historical Provider, MD  ketorolac (TORADOL) 10 MG tablet Take 1 tablet (10 mg total) by mouth every 8 (eight) hours. 02/01/16   Caylan Schifano V Bacon Chantay Whitelock, PA-C  levofloxacin (LEVAQUIN) 500 MG tablet Take 500 mg by mouth daily.    Historical Provider, MD  montelukast (SINGULAIR) 10 MG tablet Take 10 mg by mouth at bedtime.    Historical Provider, MD  OXYQUINOLONE SULFATE VAGINAL (TRIMO-SAN) 0.025 % GEL Place 0.025 mg vaginally 2 (two) times a week. 01/29/16   Hildred Laser, MD  pantoprazole (PROTONIX) 40 MG tablet Take 40 mg by mouth daily.    Historical Provider, MD  predniSONE (DELTASONE) 10 MG tablet Take 10 mg by mouth daily with breakfast.    Historical Provider, MD  SUMAtriptan (IMITREX) 50 MG tablet Take 2 tablets (100 mg total) by mouth once as needed for migraine. May repeat in 2 hours if headache persists or recurs. Do not exceed 200 mg per day. 05/27/15   Delorise Royals Cuthriell, PA-C  tiotropium (SPIRIVA) 18 MCG inhalation capsule  Place 18 mcg into inhaler and inhale daily.    Historical Provider, MD  traMADol (ULTRAM) 50 MG tablet Take 1 tablet (50 mg total) by mouth every 6 (six) hours as needed. 01/19/15   Chinita Pesterari B Triplett, FNP    Allergies Penicillins  History reviewed. No pertinent family history.  Social History Social History  Substance Use Topics  . Smoking status: Never Smoker  . Smokeless tobacco: Never Used  . Alcohol use No    Review of Systems  Constitutional:  Negative for fever. Cardiovascular: Negative for chest pain. Respiratory: Negative for shortness of breath. Gastrointestinal: Negative for abdominal pain, vomiting and diarrhea. Genitourinary: Negative for dysuria. Musculoskeletal: Positive for back pain. Skin: Negative for rash. Neurological: Negative for headaches, focal weakness or numbness. ____________________________________________  PHYSICAL EXAM:  VITAL SIGNS: ED Triage Vitals [02/01/16 1847]  Enc Vitals Group     BP (!) 137/104     Pulse Rate 93     Resp 18     Temp 98.3 F (36.8 C)     Temp Source Oral     SpO2 96 %     Weight (!) 328 lb (148.8 kg)     Height 5\' 7"  (1.702 m)     Head Circumference      Peak Flow      Pain Score 10     Pain Loc      Pain Edu?      Excl. in GC?    Constitutional: Alert and oriented. Well appearing and in no distress. Head: Normocephalic and atraumatic. Cardiovascular: Normal rate, regular rhythm.  Respiratory: Normal respiratory effort. No wheezes/rales/rhonchi. Gastrointestinal: Obese. Soft and nontender.  Musculoskeletal: Normal spinal vomit without midline tenderness, spasm, deformity, or step-off. Patient with self-limited range of motion with lumbar flexion and extension. Nontender with normal range of motion in all extremities.  Neurologic:  Normal gait without ataxia. Normal speech and language. No gross focal neurologic deficits are appreciated. Skin:  Skin is warm, dry and intact. No rash noted. ________________________  PROCEDURES  Toradol 60 mg IM Valium 10 mg PO ____________________________________________  INITIAL IMPRESSION / ASSESSMENT AND PLAN / ED COURSE  Patient with an acute flare of her chronic low back pain without sciatica irritation at this time. She is reporting improvement of her symptoms following IM and by mouth medications in the ED. She is discharged with a prescription for ketorolac to dose in addition to her home prescription of muscle relaxant.  She will follow-up with Dr. Lacie ScottsNiemeyer for her MRI that is planned as scheduled.  Clinical Course   ____________________________________________  FINAL CLINICAL IMPRESSION(S) / ED DIAGNOSES  Final diagnoses:  Chronic back pain  Midline low back pain without sciatica      Lissa HoardJenise V Bacon Monita Swier, PA-C 02/06/16 1928    Nita Sicklearolina Veronese, MD 02/08/16 [phone]

## 2016-03-01 ENCOUNTER — Ambulatory Visit: Payer: Medicaid Other

## 2016-03-09 ENCOUNTER — Ambulatory Visit (INDEPENDENT_AMBULATORY_CARE_PROVIDER_SITE_OTHER): Payer: Medicaid Other | Admitting: Urology

## 2016-03-09 VITALS — BP 121/77 | HR 83 | Ht 67.0 in | Wt 337.9 lb

## 2016-03-09 DIAGNOSIS — N3945 Continuous leakage: Secondary | ICD-10-CM | POA: Diagnosis not present

## 2016-03-09 DIAGNOSIS — N816 Rectocele: Secondary | ICD-10-CM | POA: Diagnosis not present

## 2016-03-09 DIAGNOSIS — N8110 Cystocele, unspecified: Secondary | ICD-10-CM

## 2016-03-09 LAB — URINALYSIS, COMPLETE
Bilirubin, UA: NEGATIVE
Glucose, UA: NEGATIVE
KETONES UA: NEGATIVE
Leukocytes, UA: NEGATIVE
NITRITE UA: NEGATIVE
PH UA: 6.5 (ref 5.0–7.5)
Protein, UA: NEGATIVE
SPEC GRAV UA: 1.02 (ref 1.005–1.030)
Urobilinogen, Ur: 0.2 mg/dL (ref 0.2–1.0)

## 2016-03-09 LAB — MICROSCOPIC EXAMINATION
BACTERIA UA: NONE SEEN
WBC, UA: NONE SEEN /hpf (ref 0–?)

## 2016-03-09 NOTE — Progress Notes (Signed)
03/09/2016 3:00 PM   Sheri Carter Aug 23, 1965 [id number]  Referring provider: [name], MD [place] [address] [place], [place] [address]  Chief Complaint  Patient presents with  . Follow-up    Continuous leakage of urine    HPI: Ms Sheri Carter is a 50yo seen today for continuous leakage. G2P2. The incontinence started 4 years ago and is continuous. She uses 5 pads per day and are soaked. She has both stress and urge incontinence. She was fitted with a pessary which caused an inability to urinate. She denies any vaginal bulge. She has been tried on multiple anticholinergics and mirabegron which helped slightly. No spine operations. NO numbness/tingling in extremities. She has nocturia 4x which is a large volume.  No other exacerbating/allevaiting events. No other associated symptoms. She has OSA and uses a CPAP.    PMH: Past Medical History:  Diagnosis Date  . COPD (chronic obstructive pulmonary disease) (HCC)   . Migraines   . Obesity, morbid, BMI 50 or higher (HCC) 09/25/2015  . Overactive bladder   . Sleep apnea     Surgical History: Past Surgical History:  Procedure Laterality Date  . ABDOMINAL HYSTERECTOMY      Home Medications:    Medication List       Accurate as of 03/09/16  3:00 PM. Always use your most recent med list.          albuterol 108 (90 Base) MCG/ACT inhaler Commonly known as:  PROVENTIL HFA;VENTOLIN HFA Inhale into the lungs every 6 (six) hours as needed for wheezing or shortness of breath.   albuterol (2.5 MG/3ML) 0.083% nebulizer solution Commonly known as:  PROVENTIL Take 2.5 mg by nebulization every 6 (six) hours as needed for wheezing or shortness of breath.   benzonatate 200 MG capsule Commonly known as:  TESSALON Take 200 mg by mouth 3 (three) times daily as needed for cough.   cyclobenzaprine 5 MG tablet Commonly known as:  FLEXERIL Take 1 tablet (5 mg total) by mouth 3 (three) times daily as needed for muscle spasms.     ergocalciferol 50000 units capsule Commonly known as:  VITAMIN D2 Take 50,000 Units by mouth once a week.   fluticasone 50 MCG/ACT nasal spray Commonly known as:  FLONASE Place into both nostrils daily.   Fluticasone-Salmeterol 250-50 MCG/DOSE Aepb Commonly known as:  ADVAIR Inhale 1 puff into the lungs 2 (two) times daily.   ketorolac 10 MG tablet Commonly known as:  TORADOL Take 1 tablet (10 mg total) by mouth every 8 (eight) hours.   levofloxacin 500 MG tablet Commonly known as:  LEVAQUIN Take 500 mg by mouth daily.   montelukast 10 MG tablet Commonly known as:  SINGULAIR Take 10 mg by mouth at bedtime.   OXYQUINOLONE SULFATE VAGINAL 0.025 % Gel Commonly known as:  TRIMO-SAN Place 0.025 mg vaginally 2 (two) times a week.   predniSONE 10 MG tablet Commonly known as:  DELTASONE Take 10 mg by mouth daily with breakfast.   PROTONIX 40 MG tablet Generic drug:  pantoprazole Take 40 mg by mouth daily.   SUMAtriptan 50 MG tablet Commonly known as:  IMITREX Take 2 tablets (100 mg total) by mouth once as needed for migraine. May repeat in 2 hours if headache persists or recurs. Do not exceed 200 mg per day.   tiotropium 18 MCG inhalation capsule Commonly known as:  SPIRIVA Place 18 mcg into inhaler and inhale daily.   traMADol 50 MG tablet Commonly known as:  ULTRAM Take  1 tablet (50 mg total) by mouth every 6 (six) hours as needed.       Allergies:  Allergies  Allergen Reactions  . Penicillins Itching    Family History: No family history on file.  Social History:  reports that she has never smoked. She has never used smokeless tobacco. She reports that she does not drink alcohol or use drugs.  ROS: UROLOGY Frequent Urination?: Yes Hard to postpone urination?: Yes Burning/pain with urination?: No Get up at night to urinate?: Yes Leakage of urine?: Yes Urine stream starts and stops?: Yes Trouble starting stream?: No Do you have to strain to urinate?:  No Blood in urine?: Yes Urinary tract infection?: No Sexually transmitted disease?: No Injury to kidneys or bladder?: No Painful intercourse?: No Weak stream?: Yes Currently pregnant?: No Vaginal bleeding?: No Last menstrual period?: n  Gastrointestinal Nausea?: No Vomiting?: No Indigestion/heartburn?: Yes Diarrhea?: Yes Constipation?: No  Constitutional Fever: No Night sweats?: No Weight loss?: No Fatigue?: Yes  Skin Skin rash/lesions?: No Itching?: No  Eyes Blurred vision?: Yes Double vision?: Yes  Ears/Nose/Throat Sore throat?: Yes Sinus problems?: Yes  Hematologic/Lymphatic Swollen glands?: No Easy bruising?: Yes  Cardiovascular Leg swelling?: Yes Chest pain?: Yes  Respiratory Cough?: Yes Shortness of breath?: Yes  Endocrine Excessive thirst?: Yes  Musculoskeletal Back pain?: Yes Joint pain?: Yes  Neurological Headaches?: Yes Dizziness?: Yes  Psychologic Depression?: Yes Anxiety?: Yes  Physical Exam: BP 121/77   Pulse 83   Ht 5\' 7"  (1.702 m)   Wt (!) 153.3 kg (337 lb 14.4 oz)   BMI 52.92 kg/m   Constitutional:  Alert and oriented, No acute distress. HEENT: [place] AT, moist mucus membranes.  Trachea midline, no masses. Cardiovascular: No clubbing, cyanosis, or edema. Respiratory: Normal respiratory effort, no increased work of breathing. GI: Abdomen is soft, nontender, nondistended, no abdominal masses GU: No CVA tenderness. No vulvar/labial lesions, well estrogenized. No vaginal tenderness Grade 1 cystocele, grade 1 rectocele. posstive leakage with cough. PVR 40cc Skin: No rashes, bruises or suspicious lesions. Lymph: No cervical or inguinal adenopathy. Neurologic: Grossly intact, no focal deficits, moving all 4 extremities. Psychiatric: Normal mood and affect.  Laboratory Data: Lab Results  Component Value Date   WBC 10.2 05/16/2013   HGB 14.1 05/16/2013   HCT 44.0 05/16/2013   MCV 87 05/16/2013   PLT 253 05/16/2013    Lab  Results  Component Value Date   CREATININE 0.59 (L) 05/16/2013    No results found for: PSA  No results found for: TESTOSTERONE  No results found for: HGBA1C  Urinalysis    Component Value Date/Time   COLORURINE YELLOW (A) 02/01/2016 2038   APPEARANCEUR CLEAR (A) 02/01/2016 2038   APPEARANCEUR Cloudy 03/11/2013 1944   LABSPEC 1.021 02/01/2016 2038   LABSPEC 1.018 03/11/2013 1944   PHURINE 5.0 02/01/2016 2038   GLUCOSEU NEGATIVE 02/01/2016 2038   GLUCOSEU Negative 03/11/2013 1944   HGBUR NEGATIVE 02/01/2016 2038   BILIRUBINUR NEGATIVE 02/01/2016 2038   BILIRUBINUR neg 09/24/2015 1630   BILIRUBINUR Negative 03/11/2013 1944   KETONESUR NEGATIVE 02/01/2016 2038   PROTEINUR NEGATIVE 02/01/2016 2038   UROBILINOGEN negative 09/24/2015 1630   NITRITE NEGATIVE 02/01/2016 2038   LEUKOCYTESUR NEGATIVE 02/01/2016 2038   LEUKOCYTESUR 3+ 03/11/2013 1944    Pertinent Imaging: none  Assessment & Plan:    1. Continuous leakage of urine, SUI predominant -referral to Dr. Lissa Hoard for discussion of continence procedures - Urinalysis, Complete - Bladder Scan (Post Void Residual) in office  2. Cystocele  with rectocele  - Urinalysis, Complete - Bladder Scan (Post Void Residual) in office   No Follow-up on file.  Wilkie AyePatrick Braydon Kullman, MD  [facility] [address], [address] [place], [place] [address] [phone]

## 2016-05-14 ENCOUNTER — Ambulatory Visit: Payer: Medicaid Other | Admitting: Urology

## 2016-05-14 VITALS — BP 116/80 | HR 83 | Ht 67.0 in | Wt 340.6 lb

## 2016-05-14 DIAGNOSIS — N390 Urinary tract infection, site not specified: Secondary | ICD-10-CM

## 2016-05-14 DIAGNOSIS — N8110 Cystocele, unspecified: Secondary | ICD-10-CM

## 2016-05-14 DIAGNOSIS — N3945 Continuous leakage: Secondary | ICD-10-CM

## 2016-05-14 DIAGNOSIS — N816 Rectocele: Secondary | ICD-10-CM | POA: Diagnosis not present

## 2016-05-14 DIAGNOSIS — N3946 Mixed incontinence: Secondary | ICD-10-CM | POA: Insufficient documentation

## 2016-05-14 LAB — URINALYSIS, COMPLETE
Bilirubin, UA: NEGATIVE
GLUCOSE, UA: NEGATIVE
KETONES UA: NEGATIVE
Leukocytes, UA: NEGATIVE
NITRITE UA: NEGATIVE
Protein, UA: NEGATIVE
RBC, UA: NEGATIVE
SPEC GRAV UA: 1.02 (ref 1.005–1.030)
UUROB: 1 mg/dL (ref 0.2–1.0)
pH, UA: 6.5 (ref 5.0–7.5)

## 2016-05-14 LAB — MICROSCOPIC EXAMINATION: Bacteria, UA: NONE SEEN

## 2016-05-14 NOTE — Progress Notes (Signed)
05/14/2016 9:45 AM   Sheri Carter Mar 12, 1966 [id number]  Referring provider: [name], MD [facility] [address] [place], [place] [address]  Chief Complaint  Patient presents with  . Follow-up    urinary incontinence     HPI: Dr [name] Sheri Carter is a 50yo seen today for continuous leakage. G2P2. The incontinence started 4 years ago and is continuous. She uses 5 pads per day and are soaked. She has both stress and urge incontinence. She was fitted with a pessary which caused an inability to urinate. She denies any vaginal bulge. She has been tried on multiple anticholinergics and mirabegron which helped slightly. No spine operations. NO numbness/tingling in extremities. She has nocturia 4x which is a large volume.  No other exacerbating/allevaiting events. No other associated symptoms. She has OSA and uses a CPAP.    GU: No CVA tenderness. No vulvar/labial lesions, well estrogenized. No vaginal tenderness Grade 1 cystocele, grade 1 rectocele. posstive leakage with cough. PVR 40cc  Today The patient reports mixed stress and urge incontinence. She leaks with coughing and sneezing but not bending and lifting. She reports a stress component is most significant. She has mild bedwetting. She can soak 8 pads per day  She voids every 2-3 hours and used a 3 times a night.  She reports 7 bladder infections a year that responded favorably to antibiotics. She has typical cystitis symptoms.  By history she is failed Vesicare and Detrol and perhaps other antimuscarinics. She is currently on mirror metric each helped initially.  She is prone to constipation and has had a hysterectomy. She is a borderline diabetic.  She is morbidly obese and recently went on home oxygen  Modifying factors: There are no other modifying factors  Associated signs and symptoms: There are no other associated signs and symptoms Aggravating and relieving factors: There are no other aggravating or relieving  factors Severity: Moderate Duration: Persistent  PMH: Past Medical History:  Diagnosis Date  . COPD (chronic obstructive pulmonary disease) (HCC)   . Migraines   . Obesity, morbid, BMI 50 or higher (HCC) 09/25/2015  . Overactive bladder   . Sleep apnea     Surgical History: Past Surgical History:  Procedure Laterality Date  . ABDOMINAL HYSTERECTOMY      Home Medications:    Medication List       Accurate as of 05/14/16  9:45 AM. Always use your most recent med list.          albuterol 108 (90 Base) MCG/ACT inhaler Commonly known as:  PROVENTIL HFA;VENTOLIN HFA Inhale into the lungs every 6 (six) hours as needed for wheezing or shortness of breath.   albuterol (2.5 MG/3ML) 0.083% nebulizer solution Commonly known as:  PROVENTIL Take 2.5 mg by nebulization every 6 (six) hours as needed for wheezing or shortness of breath.   benzonatate 200 MG capsule Commonly known as:  TESSALON Take 200 mg by mouth 3 (three) times daily as needed for cough.   cyclobenzaprine 5 MG tablet Commonly known as:  FLEXERIL Take 1 tablet (5 mg total) by mouth 3 (three) times daily as needed for muscle spasms.   ergocalciferol 50000 units capsule Commonly known as:  VITAMIN D2 Take 50,000 Units by mouth once a week.   fluticasone 50 MCG/ACT nasal spray Commonly known as:  FLONASE Place into both nostrils daily.   Fluticasone-Salmeterol 250-50 MCG/DOSE Aepb Commonly known as:  ADVAIR Inhale 1 puff into the lungs 2 (two) times daily.   ketorolac 10 MG  tablet Commonly known as:  TORADOL Take 1 tablet (10 mg total) by mouth every 8 (eight) hours.   methocarbamol 500 MG tablet Commonly known as:  ROBAXIN Take 500 mg by mouth 3 (three) times daily.   montelukast 10 MG tablet Commonly known as:  SINGULAIR Take 10 mg by mouth at bedtime.   OXYGEN Inhale 2 L into the lungs.   OXYQUINOLONE SULFATE VAGINAL 0.025 % Gel Commonly known as:  TRIMO-SAN Place 0.025 mg vaginally 2 (two)  times a week.   predniSONE 10 MG tablet Commonly known as:  DELTASONE Take 10 mg by mouth daily with breakfast.   PROTONIX 40 MG tablet Generic drug:  pantoprazole Take 40 mg by mouth daily.   SUMAtriptan 50 MG tablet Commonly known as:  IMITREX Take 2 tablets (100 mg total) by mouth once as needed for migraine. May repeat in 2 hours if headache persists or recurs. Do not exceed 200 mg per day.   tiotropium 18 MCG inhalation capsule Commonly known as:  SPIRIVA Place 18 mcg into inhaler and inhale daily.   traMADol 50 MG tablet Commonly known as:  ULTRAM Take 1 tablet (50 mg total) by mouth every 6 (six) hours as needed.       Allergies:  Allergies  Allergen Reactions  . Penicillins Itching    Family History: No family history on file.  Social History:  reports that she has never smoked. She has never used smokeless tobacco. She reports that she does not drink alcohol or use drugs.  ROS: UROLOGY Frequent Urination?: No Hard to postpone urination?: No Burning/pain with urination?: No Get up at night to urinate?: Yes Leakage of urine?: Yes Urine stream starts and stops?: No Trouble starting stream?: No Do you have to strain to urinate?: No Blood in urine?: No Urinary tract infection?: No Sexually transmitted disease?: No Injury to kidneys or bladder?: No Painful intercourse?: No Weak stream?: No Currently pregnant?: No Vaginal bleeding?: No Last menstrual period?: n  Gastrointestinal Nausea?: No Vomiting?: No Indigestion/heartburn?: No Diarrhea?: No Constipation?: No  Constitutional Fever: No Night sweats?: No Weight loss?: No Fatigue?: No  Skin Skin rash/lesions?: No Itching?: No  Eyes Blurred vision?: Yes Double vision?: No  Ears/Nose/Throat Sore throat?: No Sinus problems?: No  Hematologic/Lymphatic Swollen glands?: No Easy bruising?: No  Cardiovascular Leg swelling?: No Chest pain?: No  Respiratory Cough?: Yes Shortness of  breath?: Yes  Endocrine Excessive thirst?: Yes  Musculoskeletal Back pain?: Yes Joint pain?: Yes  Neurological Headaches?: No Dizziness?: No  Psychologic Depression?: No Anxiety?: No  Physical Exam: BP 116/80   Pulse 83   Ht 5\' 7"  (1.702 m)   Wt (!) 340 lb 9.6 oz (154.5 kg)   BMI 53.35 kg/m    Laboratory Data: Lab Results  Component Value Date   WBC 10.2 05/16/2013   HGB 14.1 05/16/2013   HCT 44.0 05/16/2013   MCV 87 05/16/2013   PLT 253 05/16/2013    Lab Results  Component Value Date   CREATININE 0.59 (L) 05/16/2013    No results found for: PSA  No results found for: TESTOSTERONE  No results found for: HGBA1C  Urinalysis    Component Value Date/Time   COLORURINE YELLOW (A) 02/01/2016 2038   APPEARANCEUR Hazy (A) 03/09/2016 1438   LABSPEC 1.021 02/01/2016 2038   LABSPEC 1.018 03/11/2013 1944   PHURINE 5.0 02/01/2016 2038   GLUCOSEU Negative 03/09/2016 1438   GLUCOSEU Negative 03/11/2013 1944   HGBUR NEGATIVE 02/01/2016 2038   BILIRUBINUR  Negative 03/09/2016 1438   BILIRUBINUR Negative 03/11/2013 1944   KETONESUR NEGATIVE 02/01/2016 2038   PROTEINUR Negative 03/09/2016 1438   PROTEINUR NEGATIVE 02/01/2016 2038   UROBILINOGEN negative 09/24/2015 1630   NITRITE Negative 03/09/2016 1438   NITRITE NEGATIVE 02/01/2016 2038   LEUKOCYTESUR Negative 03/09/2016 1438   LEUKOCYTESUR 3+ 03/11/2013 1944    Pertinent Imaging: none  Assessment & Plan:  The patient has mixed incontinence. Her obesity and pulmonary status and comorbidities are certainly not ideal for considering surgery. The beta 3 agonists may have lessened its effect over time if she truly is having recurrent bladder infections.  I will like to have her come back for repeat pelvic examination cystoscopy and order a CT stone protocol. I did not order an ultrasound due to body habitus. I likely would then put her on prophylaxis and see if some of her symptoms settled down. I would then consider  urodynamics. I urethral injectable is another potential option if one was going to treat her outlet. Percutaneous tibial nerve stimulation is another potential option she primarily has an overactive bladder  The patient just started on home oxygen and reports very poor lung function  1. Cystocele with rectocele 2.  Mixed urinary incontinence 3. Urinary frequency 4. Chronic cystitis - Urinalysis, Complete  2. Continuous leakage of urine  - Urinalysis, Complete   Return in about 4 weeks (around 06/11/2016) for see me january for cysto and pelvic exam.  Martina SinnerMACDIARMID,Jaterrius Ricketson A, MD  [facility] [address], [address] [place], [place] [address] [phone]) [phone]

## 2016-05-14 NOTE — Addendum Note (Signed)
Addended by: Lonna CobbUSSELL, Orlinda Slomski L on: 05/14/2016 01:58 PM   Modules accepted: Orders

## 2016-05-19 ENCOUNTER — Telehealth: Payer: Self-pay

## 2016-05-19 LAB — CULTURE, URINE COMPREHENSIVE

## 2016-05-19 NOTE — Telephone Encounter (Signed)
Pt called requesting urine culture results.

## 2016-06-02 ENCOUNTER — Ambulatory Visit
Admission: RE | Admit: 2016-06-02 | Discharge: 2016-06-02 | Disposition: A | Discharge status: Home / Self Care | Payer: Medicaid Other | Source: Ambulatory Visit | Attending: Urology | Admitting: Urology

## 2016-06-02 DIAGNOSIS — N3945 Continuous leakage: Secondary | ICD-10-CM | POA: Insufficient documentation

## 2016-06-02 DIAGNOSIS — K8020 Calculus of gallbladder without cholecystitis without obstruction: Secondary | ICD-10-CM | POA: Insufficient documentation

## 2016-06-02 DIAGNOSIS — N8110 Cystocele, unspecified: Secondary | ICD-10-CM | POA: Diagnosis not present

## 2016-06-02 DIAGNOSIS — K760 Fatty (change of) liver, not elsewhere classified: Secondary | ICD-10-CM | POA: Insufficient documentation

## 2016-06-02 DIAGNOSIS — N816 Rectocele: Secondary | ICD-10-CM | POA: Diagnosis not present

## 2016-06-02 DIAGNOSIS — N390 Urinary tract infection, site not specified: Secondary | ICD-10-CM | POA: Diagnosis not present

## 2016-06-08 ENCOUNTER — Ambulatory Visit: Payer: Medicaid Other | Admitting: Urology

## 2016-06-08 ENCOUNTER — Encounter: Payer: Self-pay | Admitting: Urology

## 2016-06-08 VITALS — BP 143/85 | HR 87 | Ht 67.0 in | Wt 350.3 lb

## 2016-06-08 DIAGNOSIS — N3946 Mixed incontinence: Secondary | ICD-10-CM

## 2016-06-08 DIAGNOSIS — N390 Urinary tract infection, site not specified: Secondary | ICD-10-CM | POA: Diagnosis not present

## 2016-06-08 DIAGNOSIS — N816 Rectocele: Secondary | ICD-10-CM

## 2016-06-08 DIAGNOSIS — N8110 Cystocele, unspecified: Secondary | ICD-10-CM | POA: Diagnosis not present

## 2016-06-08 LAB — MICROSCOPIC EXAMINATION: BACTERIA UA: NONE SEEN

## 2016-06-08 LAB — URINALYSIS, COMPLETE
Bilirubin, UA: NEGATIVE
Glucose, UA: NEGATIVE
Ketones, UA: NEGATIVE
LEUKOCYTES UA: NEGATIVE
Nitrite, UA: NEGATIVE
PH UA: 6.5 (ref 5.0–7.5)
PROTEIN UA: NEGATIVE
RBC, UA: NEGATIVE
Specific Gravity, UA: 1.01 (ref 1.005–1.030)
Urobilinogen, Ur: 1 mg/dL (ref 0.2–1.0)

## 2016-06-08 MED ORDER — LIDOCAINE HCL 2 % EX GEL
1.0000 "application " | Freq: Once | CUTANEOUS | Status: AC
  Administered 2016-06-08: 1 via URETHRAL

## 2016-06-08 MED ORDER — CIPROFLOXACIN HCL 500 MG PO TABS
500.0000 mg | ORAL_TABLET | Freq: Once | ORAL | Status: AC
  Administered 2016-06-08: 500 mg via ORAL

## 2016-06-08 MED ORDER — TRIMETHOPRIM 100 MG PO TABS: 100.0000 mg | ORAL_TABLET | Freq: Every day | ORAL | 11 refills | Status: DC

## 2016-06-08 NOTE — Progress Notes (Signed)
06/08/2016 12:30 PM   Sheri Carter 1965/09/16 [id number]  Referring provider: [name], MD [facility] [address] [place], [place] [address]  Chief Complaint  Patient presents with  . Cysto    HPI: 1 I saw the patient December 8 she had mixed stress incontinence.  She leaks with coughing and sneezing but not bending and lifting. She reports a stress component is most significant. She has mild bedwetting. She can soak 8 pads per day  She voids every 2-3 hours and used a 3 times a night.  She reports 7 bladder infections a year that responded favorably to antibiotics. She has typical cystitis symptoms.  By history she is failed Vesicare and Detrol and perhaps other antimuscarinics. She is currently on a beta 3 agonist each helped initially.  She is morbidly obese and recently went on home oxygen., Based on pulmonary issues and obesity she was not a good surgical candidate. I wanted her to return for a pelvic examination cystoscopy and I ordered a CT scan stone protocol. I likely would then put her on prophylaxis and see if some of her symptoms settled down. I would then consider urodynamics. I urethral injectable is another potential option if one was going to treat her outlet. Percutaneous tibial nerve stimulation is another potential option she primarily has an overactive bladder  Pelvic examination was somewhat limited due to obesity and labia. She did have what appeared to be mild grade 2 hypermobility the bladder neck and native cough test. She had no sniff can prolapse  She underwent cystoscopy after written consent. She had mild cystitis cystica. The urine was clear. She had no obvious acute cystitis. The trigone was normal. She was a bit tender. There was no carcinoma. The urinalysis was negative   PMH: Past Medical History:  Diagnosis Date  . COPD (chronic obstructive pulmonary disease) (HCC)   . Migraines   . Obesity, morbid, BMI 50 or higher (HCC) 09/25/2015  .  Overactive bladder   . Sleep apnea     Surgical History: Past Surgical History:  Procedure Laterality Date  . ABDOMINAL HYSTERECTOMY      Home Medications:  Allergies as of 06/08/2016      Reactions   Penicillins Itching      Medication List       Accurate as of 06/08/16 12:30 PM. Always use your most recent med list.          albuterol 108 (90 Base) MCG/ACT inhaler Commonly known as:  PROVENTIL HFA;VENTOLIN HFA Inhale into the lungs every 6 (six) hours as needed for wheezing or shortness of breath.   albuterol (2.5 MG/3ML) 0.083% nebulizer solution Commonly known as:  PROVENTIL Take 2.5 mg by nebulization every 6 (six) hours as needed for wheezing or shortness of breath.   benzonatate 200 MG capsule Commonly known as:  TESSALON Take 200 mg by mouth 3 (three) times daily as needed for cough.   cyclobenzaprine 5 MG tablet Commonly known as:  FLEXERIL Take 1 tablet (5 mg total) by mouth 3 (three) times daily as needed for muscle spasms.   ergocalciferol 50000 units capsule Commonly known as:  VITAMIN D2 Take 50,000 Units by mouth once a week.   fluticasone 50 MCG/ACT nasal spray Commonly known as:  FLONASE Place into both nostrils daily.   Fluticasone-Salmeterol 250-50 MCG/DOSE Aepb Commonly known as:  ADVAIR Inhale 1 puff into the lungs 2 (two) times daily.   ketorolac 10 MG tablet Commonly known as:  TORADOL Take 1  tablet (10 mg total) by mouth every 8 (eight) hours.   methocarbamol 500 MG tablet Commonly known as:  ROBAXIN Take 500 mg by mouth 3 (three) times daily.   montelukast 10 MG tablet Commonly known as:  SINGULAIR Take 10 mg by mouth at bedtime.   OXYGEN Inhale 2 L into the lungs.   OXYQUINOLONE SULFATE VAGINAL 0.025 % Gel Commonly known as:  TRIMO-SAN Place 0.025 mg vaginally 2 (two) times a week.   predniSONE 10 MG tablet Commonly known as:  DELTASONE Take 10 mg by mouth daily with breakfast.   PROTONIX 40 MG tablet Generic drug:   pantoprazole Take 40 mg by mouth daily.   SUMAtriptan 50 MG tablet Commonly known as:  IMITREX Take 2 tablets (100 mg total) by mouth once as needed for migraine. May repeat in 2 hours if headache persists or recurs. Do not exceed 200 mg per day.   tiotropium 18 MCG inhalation capsule Commonly known as:  SPIRIVA Place 18 mcg into inhaler and inhale daily.   traMADol 50 MG tablet Commonly known as:  ULTRAM Take 1 tablet (50 mg total) by mouth every 6 (six) hours as needed.       Allergies:  Allergies  Allergen Reactions  . Penicillins Itching    Family History: No family history on file.  Social History:  reports that she has never smoked. She has never used smokeless tobacco. She reports that she does not drink alcohol or use drugs.  ROS:                                        Physical Exam: BP (!) 143/85   Pulse 87   Ht 5\' 7"  (1.702 m)   Wt (!) 350 lb 4.8 oz (158.9 kg)   BMI 54.86 kg/m       Laboratory Data: Lab Results  Component Value Date   WBC 10.2 05/16/2013   HGB 14.1 05/16/2013   HCT 44.0 05/16/2013   MCV 87 05/16/2013   PLT 253 05/16/2013    Lab Results  Component Value Date   CREATININE 0.59 (L) 05/16/2013    No results found for: PSA  No results found for: TESTOSTERONE  No results found for: HGBA1C  Urinalysis    Component Value Date/Time   COLORURINE YELLOW (A) 02/01/2016 2038   APPEARANCEUR Clear 05/14/2016 0957   LABSPEC 1.021 02/01/2016 2038   LABSPEC 1.018 03/11/2013 1944   PHURINE 5.0 02/01/2016 2038   GLUCOSEU Negative 05/14/2016 0957   GLUCOSEU Negative 03/11/2013 1944   HGBUR NEGATIVE 02/01/2016 2038   BILIRUBINUR Negative 05/14/2016 0957   BILIRUBINUR Negative 03/11/2013 1944   KETONESUR NEGATIVE 02/01/2016 2038   PROTEINUR Negative 05/14/2016 0957   PROTEINUR NEGATIVE 02/01/2016 2038   UROBILINOGEN negative 09/24/2015 1630   NITRITE Negative 05/14/2016 0957   NITRITE NEGATIVE 02/01/2016  2038   LEUKOCYTESUR Negative 05/14/2016 0957   LEUKOCYTESUR 3+ 03/11/2013 1944    Pertinent Imaging: CT scan normal  Assessment & Plan:  The patient will be placed on prophylactic antibiotics and her incontinence reassessed in 6 weeks. Hopefully this will down regulate some of her symptoms. Her incontinence and frequency are stable. She was started on trimethoprim 100 mg daily with 30 tablets and 11 refills  There are no diagnoses linked to this encounter.  No Follow-up on file.  Martina SinnerMACDIARMID,Mistey Hoffert A, MD  [facility] [address],  [place], [place] [address] [phone]

## 2016-07-21 ENCOUNTER — Encounter: Payer: Self-pay | Admitting: Urology

## 2016-07-21 ENCOUNTER — Ambulatory Visit: Payer: Medicaid Other | Admitting: Urology

## 2016-09-18 ENCOUNTER — Encounter: Payer: Self-pay | Admitting: Emergency Medicine

## 2016-09-18 ENCOUNTER — Emergency Department
Admission: EM | Admit: 2016-09-18 | Discharge: 2016-09-19 | Disposition: A | Discharge status: Home / Self Care | Payer: Medicaid Other | Attending: Emergency Medicine | Admitting: Emergency Medicine

## 2016-09-18 DIAGNOSIS — M545 Low back pain, unspecified: Secondary | ICD-10-CM

## 2016-09-18 DIAGNOSIS — R1032 Left lower quadrant pain: Secondary | ICD-10-CM

## 2016-09-18 DIAGNOSIS — G8929 Other chronic pain: Secondary | ICD-10-CM | POA: Diagnosis not present

## 2016-09-18 HISTORY — DX: Unspecified asthma, uncomplicated: J45.909

## 2016-09-18 LAB — CBC
HEMATOCRIT: 42.7 % (ref 35.0–47.0)
HEMOGLOBIN: 14.5 g/dL (ref 12.0–16.0)
MCH: 29.7 pg (ref 26.0–34.0)
MCHC: 34 g/dL (ref 32.0–36.0)
MCV: 87.3 fL (ref 80.0–100.0)
Platelets: 205 10*3/uL (ref 150–440)
RBC: 4.89 MIL/uL (ref 3.80–5.20)
RDW: 14.3 % (ref 11.5–14.5)
WBC: 10.4 10*3/uL (ref 3.6–11.0)

## 2016-09-18 LAB — URINALYSIS, COMPLETE (UACMP) WITH MICROSCOPIC
BACTERIA UA: NONE SEEN
Glucose, UA: NEGATIVE mg/dL
HGB URINE DIPSTICK: NEGATIVE
Ketones, ur: NEGATIVE mg/dL
Leukocytes, UA: NEGATIVE
Nitrite: NEGATIVE
PROTEIN: 30 mg/dL — AB
Specific Gravity, Urine: 1.029 (ref 1.005–1.030)
pH: 5 (ref 5.0–8.0)

## 2016-09-18 LAB — COMPREHENSIVE METABOLIC PANEL
ALBUMIN: 3.7 g/dL (ref 3.5–5.0)
ALT: 26 U/L (ref 14–54)
ANION GAP: 5 (ref 5–15)
AST: 24 U/L (ref 15–41)
Alkaline Phosphatase: 72 U/L (ref 38–126)
BUN: 12 mg/dL (ref 6–20)
CO2: 25 mmol/L (ref 22–32)
Calcium: 8.8 mg/dL — ABNORMAL LOW (ref 8.9–10.3)
Chloride: 105 mmol/L (ref 101–111)
Creatinine, Ser: 0.78 mg/dL (ref 0.44–1.00)
GFR calc Af Amer: >60 mL/min (ref >60)
GFR calc non Af Amer: >60 mL/min (ref >60)
GLUCOSE: 102 mg/dL — AB (ref 65–99)
POTASSIUM: 3.8 mmol/L (ref 3.5–5.1)
SODIUM: 135 mmol/L (ref 135–145)
Total Bilirubin: 1 mg/dL (ref 0.3–1.2)
Total Protein: 7.5 g/dL (ref 6.5–8.1)

## 2016-09-18 LAB — LIPASE, BLOOD: Lipase: 19 U/L (ref 11–51)

## 2016-09-18 MED ORDER — OXYCODONE-ACETAMINOPHEN 5-325 MG PO TABS
ORAL_TABLET | ORAL | Status: AC
  Administered 2016-09-18: 1 via ORAL
  Filled 2016-09-18: qty 1

## 2016-09-18 MED ORDER — OXYCODONE-ACETAMINOPHEN 5-325 MG PO TABS
1.0000 | ORAL_TABLET | ORAL | Status: DC | PRN
  Administered 2016-09-18: 1 via ORAL
  Filled 2016-09-18: qty 1

## 2016-09-18 NOTE — ED Triage Notes (Signed)
Patient with complaint of lower abdominal pain radiating to her back that started 3 days ago. Patient states that the pain became worse today. Patient denies vomiting, diarrhea, fever or urinary symptoms.

## 2016-09-19 ENCOUNTER — Encounter: Payer: Self-pay | Admitting: Radiology

## 2016-09-19 ENCOUNTER — Emergency Department: Payer: Medicaid Other

## 2016-09-19 MED ORDER — MELOXICAM 15 MG PO TABS: 15.0000 mg | ORAL_TABLET | Freq: Every day | ORAL | 0 refills | Status: AC

## 2016-09-19 MED ORDER — ONDANSETRON HCL 4 MG/2ML IJ SOLN
4.0000 mg | Freq: Once | INTRAMUSCULAR | Status: AC
  Administered 2016-09-19: 4 mg via INTRAVENOUS
  Filled 2016-09-19: qty 2

## 2016-09-19 MED ORDER — LIDOCAINE 5 % EX PTCH: 1.0000 | MEDICATED_PATCH | Freq: Two times a day (BID) | CUTANEOUS | 0 refills | Status: AC

## 2016-09-19 MED ORDER — TRAMADOL HCL 50 MG PO TABS: 50.0000 mg | ORAL_TABLET | Freq: Four times a day (QID) | ORAL | 0 refills | Status: DC | PRN

## 2016-09-19 MED ORDER — KETOROLAC TROMETHAMINE 30 MG/ML IJ SOLN
30.0000 mg | Freq: Once | INTRAMUSCULAR | Status: AC
  Administered 2016-09-19: 30 mg via INTRAVENOUS
  Filled 2016-09-19: qty 1

## 2016-09-19 MED ORDER — MORPHINE SULFATE (PF) 4 MG/ML IV SOLN
4.0000 mg | Freq: Once | INTRAVENOUS | Status: AC
  Administered 2016-09-19: 4 mg via INTRAVENOUS
  Filled 2016-09-19: qty 1

## 2016-09-19 MED ORDER — IOPAMIDOL (ISOVUE-300) INJECTION 61%
100.0000 mL | Freq: Once | INTRAVENOUS | Status: AC | PRN
  Administered 2016-09-19: 100 mL via INTRAVENOUS

## 2016-09-19 MED ORDER — IOPAMIDOL (ISOVUE-300) INJECTION 61%
30.0000 mL | Freq: Once | INTRAVENOUS | Status: AC
  Administered 2016-09-19: 30 mL via ORAL

## 2016-09-19 MED ORDER — LIDOCAINE 5 % EX PTCH
1.0000 | MEDICATED_PATCH | CUTANEOUS | Status: DC
  Administered 2016-09-19: 1 via TRANSDERMAL
  Filled 2016-09-19: qty 1

## 2016-09-19 NOTE — ED Provider Notes (Signed)
[facility] Emergency Department Provider Note   ____________________________________________   First MD Initiated Contact with Patient 09/18/16 2354     (approximate)  I have reviewed the triage vital signs and the nursing notes.   HISTORY  Chief Complaint Abdominal Pain    HPI Sheri Carter is a 51 y.o. female who comes into the hospital today with some abdomen and back pain. The patient reports that the pain started about 3 days ago but it has been getting worse. She reports that her lower abdomen on the left as well as her lower back. The patient states she's had pain in her back in the past and it does also hurt in her stomach but has never been this severe. She typically gets medicine and muscle relaxers. She denies any trauma or any overuse such as bending or lifting. The patient denies fever, nausea, vomiting, diarrhea, constipation. She's been urinating well. The patient denies any chest pain, shortness of breath. The patient rates her pain a 10 out of 10 in intensity. She is here today for evaluation.   Past Medical History:  Diagnosis Date  . Asthma   . COPD (chronic obstructive pulmonary disease) (HCC)   . Migraines   . Obesity, morbid, BMI 50 or higher (HCC) 09/25/2015  . Overactive bladder   . Sleep apnea     Patient Active Problem List   Diagnosis Date Noted  . Mixed incontinence 05/14/2016  . Cystocele with rectocele 09/25/2015  . Obesity, morbid, BMI 50 or higher (HCC) 09/25/2015  . Continuous leakage of urine 09/25/2015    Past Surgical History:  Procedure Laterality Date  . ABDOMINAL HYSTERECTOMY      Prior to Admission medications   Medication Sig Start Date End Date Taking? Authorizing Provider  albuterol (PROVENTIL HFA;VENTOLIN HFA) 108 (90 Base) MCG/ACT inhaler Inhale into the lungs every 6 (six) hours as needed for wheezing or shortness of breath.    Historical Provider, MD  albuterol (PROVENTIL) (2.5 MG/3ML) 0.083%  nebulizer solution Take 2.5 mg by nebulization every 6 (six) hours as needed for wheezing or shortness of breath.    Historical Provider, MD  benzonatate (TESSALON) 200 MG capsule Take 200 mg by mouth 3 (three) times daily as needed for cough.    Historical Provider, MD  cyclobenzaprine (FLEXERIL) 5 MG tablet Take 1 tablet (5 mg total) by mouth 3 (three) times daily as needed for muscle spasms. 02/01/16   Jenise V Bacon Menshew, PA-C  ergocalciferol (VITAMIN D2) 50000 units capsule Take 50,000 Units by mouth once a week.    Historical Provider, MD  fluticasone (FLONASE) 50 MCG/ACT nasal spray Place into both nostrils daily.    Historical Provider, MD  Fluticasone-Salmeterol (ADVAIR) 250-50 MCG/DOSE AEPB Inhale 1 puff into the lungs 2 (two) times daily.    Historical Provider, MD  ketorolac (TORADOL) 10 MG tablet Take 1 tablet (10 mg total) by mouth every 8 (eight) hours. 02/01/16   Jenise V Bacon Menshew, PA-C  lidocaine (LIDODERM) 5 % Place 1 patch onto the skin every 12 (twelve) hours. Remove & Discard patch within 12 hours or as directed by MD 09/19/16 09/19/17  Rebecka Apley, MD  meloxicam (MOBIC) 15 MG tablet Take 1 tablet (15 mg total) by mouth daily. 09/19/16 09/19/17  Rebecka Apley, MD  methocarbamol (ROBAXIN) 500 MG tablet Take 500 mg by mouth 3 (three) times daily.    Historical Provider, MD  montelukast (SINGULAIR) 10 MG tablet Take 10 mg by  mouth at bedtime.    Historical Provider, MD  OXYGEN Inhale 2 L into the lungs.    Historical Provider, MD  OXYQUINOLONE SULFATE VAGINAL (TRIMO-SAN) 0.025 % GEL Place 0.025 mg vaginally 2 (two) times a week. 01/29/16   Hildred Laser, MD  pantoprazole (PROTONIX) 40 MG tablet Take 40 mg by mouth daily.    Historical Provider, MD  predniSONE (DELTASONE) 10 MG tablet Take 10 mg by mouth daily with breakfast.    Historical Provider, MD  SUMAtriptan (IMITREX) 50 MG tablet Take 2 tablets (100 mg total) by mouth once as needed for migraine. May repeat in 2  hours if headache persists or recurs. Do not exceed 200 mg per day. 05/27/15   Delorise Royals Cuthriell, PA-C  tiotropium (SPIRIVA) 18 MCG inhalation capsule Place 18 mcg into inhaler and inhale daily.    Historical Provider, MD  traMADol (ULTRAM) 50 MG tablet Take 1 tablet (50 mg total) by mouth every 6 (six) hours as needed. 01/19/15   Chinita Pester, FNP  traMADol (ULTRAM) 50 MG tablet Take 1 tablet (50 mg total) by mouth every 6 (six) hours as needed. 09/19/16   Rebecka Apley, MD  trimethoprim (TRIMPEX) 100 MG tablet Take 1 tablet (100 mg total) by mouth daily. 06/08/16   Alfredo Martinez, MD    Allergies Penicillins  No family history on file.  Social History Social History  Substance Use Topics  . Smoking status: Never Smoker  . Smokeless tobacco: Never Used  . Alcohol use No    Review of Systems Constitutional: No fever/chills Eyes: No visual changes. ENT: No sore throat. Cardiovascular: Denies chest pain. Respiratory: Denies shortness of breath. Gastrointestinal:  abdominal pain.  No nausea, no vomiting.  No diarrhea.  No constipation. Genitourinary: Negative for dysuria. Musculoskeletal: back pain. Skin: Negative for rash. Neurological: Negative for headaches, focal weakness or numbness.  10-point ROS otherwise negative.  ____________________________________________   PHYSICAL EXAM:  VITAL SIGNS: ED Triage Vitals  Enc Vitals Group     BP 09/18/16 2039 105/63     Pulse Rate 09/18/16 2038 82     Resp 09/18/16 2038 20     Temp 09/18/16 2038 98.1 F (36.7 C)     Temp Source 09/18/16 2038 Oral     SpO2 09/18/16 2039 94 %     Weight 09/18/16 2038 300 lb (136.1 kg)     Height 09/18/16 2038  (1.676 m)     Head Circumference --      Peak Flow --      Pain Score 09/18/16 2038 10     Pain Loc --      Pain Edu? --      Excl. in GC? --     Constitutional: Alert and oriented. Well appearing and in Moderate distress. Eyes: Conjunctivae are normal. PERRL.  EOMI. Head: Atraumatic. Nose: No congestion/rhinnorhea. Mouth/Throat: Mucous membranes are moist.  Oropharynx non-erythematous. Cardiovascular: Normal rate, regular rhythm. Grossly normal heart sounds.  Good peripheral circulation. Respiratory: Normal respiratory effort.  No retractions. Lungs CTAB. Gastrointestinal: Soft With some tenderness to palpation of left abdomen. No distention. Positive bowel sounds Musculoskeletal: Tenderness to palpation of low mid back with some pain to left sciatic joint and some negative straight leg raise  Neurologic:  Normal speech and language.  Skin:  Skin is warm, dry and intact. Marland Kitchen Psychiatric: Mood and affect are normal.   ____________________________________________   LABS (all labs ordered are listed, but only abnormal results are displayed)  Labs Reviewed  COMPREHENSIVE METABOLIC PANEL - Abnormal; Notable for the following:       Result Value   Glucose, Bld 102 (*)    Calcium 8.8 (*)    All other components within normal limits  URINALYSIS, COMPLETE (UACMP) WITH MICROSCOPIC - Abnormal; Notable for the following:    Color, Urine YELLOW (*)    APPearance CLEAR (*)    Bilirubin Urine SMALL (*)    Protein, ur 30 (*)    Squamous Epithelial / LPF 0-5 (*)    All other components within normal limits  LIPASE, BLOOD  CBC   ____________________________________________  EKG  none ____________________________________________  RADIOLOGY  CT abd and pelvis ____________________________________________   PROCEDURES  Procedure(s) performed: None  Procedures  Critical Care performed: No  ____________________________________________   INITIAL IMPRESSION / ASSESSMENT AND PLAN / ED COURSE  Pertinent labs & imaging results that were available during my care of the patient were reviewed by me and considered in my medical decision making (see chart for details).  This is a 51 year old who comes into the hospital today with some back pain  and abdomen pain. The pain is on her left. The patient does appear to be some significant distress. Her white count is unremarkable but I am concerned about possible diverticulitis given the patient's pain. I did send the patient for a CT scan. I will give her some morphine and Zofran and reassess the patient.  Clinical Course as of Sep 20 314  Sun Sep 19, 2016  0226 1. No acute abnormality seen within the abdomen or pelvis. 2. Cholelithiasis. Gallbladder otherwise unremarkable.   CT Abdomen Pelvis W Contrast [AW]    Clinical Course User Index [AW] Rebecka Apley, MD   The patient's CT scan shows cholelithiasis but otherwise is unremarkable. Her pain though is on the left side and in her back. I feel this may be some of the patient's chronic pain. I will give her a shot of Toradol as well as a Lidoderm patch and I'll reassess her pain.  Patient is sitting up on the side of the bed and appears very comfortable. Her pain is improved. I informed her the results of her CT scan and informed her that she should follow up with her primary care physician. The patient understands and agrees with the plan as stated. She'll be discharged home to follow-up.  ____________________________________________   FINAL CLINICAL IMPRESSION(S) / ED DIAGNOSES  Final diagnoses:  Left lower quadrant pain  Chronic left-sided low back pain without sciatica      NEW MEDICATIONS STARTED DURING THIS VISIT:  New Prescriptions   LIDOCAINE (LIDODERM) 5 %    Place 1 patch onto the skin every 12 (twelve) hours. Remove & Discard patch within 12 hours or as directed by MD   MELOXICAM (MOBIC) 15 MG TABLET    Take 1 tablet (15 mg total) by mouth daily.   TRAMADOL (ULTRAM) 50 MG TABLET    Take 1 tablet (50 mg total) by mouth every 6 (six) hours as needed.     Note:  This document was prepared using Dragon voice recognition software and may include unintentional dictation errors.    Rebecka Apley,  MD 09/19/16 [phone]

## 2016-09-19 NOTE — ED Notes (Signed)
Pt. Going home with friend 

## 2016-09-28 ENCOUNTER — Encounter: Payer: Self-pay | Admitting: Urology

## 2016-09-28 ENCOUNTER — Ambulatory Visit (INDEPENDENT_AMBULATORY_CARE_PROVIDER_SITE_OTHER): Payer: Medicaid Other | Admitting: Urology

## 2016-09-28 DIAGNOSIS — N390 Urinary tract infection, site not specified: Secondary | ICD-10-CM

## 2016-09-28 DIAGNOSIS — N3946 Mixed incontinence: Secondary | ICD-10-CM | POA: Diagnosis not present

## 2016-09-28 LAB — URINALYSIS, COMPLETE
Bilirubin, UA: NEGATIVE
GLUCOSE, UA: NEGATIVE
Ketones, UA: NEGATIVE
Leukocytes, UA: NEGATIVE
Nitrite, UA: NEGATIVE
PH UA: 7 (ref 5.0–7.5)
PROTEIN UA: NEGATIVE
Specific Gravity, UA: 1.02 (ref 1.005–1.030)
Urobilinogen, Ur: 1 mg/dL (ref 0.2–1.0)

## 2016-09-28 LAB — MICROSCOPIC EXAMINATION
Bacteria, UA: NONE SEEN
WBC, UA: NONE SEEN /hpf (ref 0–?)

## 2016-09-28 NOTE — Progress Notes (Signed)
09/28/2016 9:07 AM   Sheri Carter 11-08-1965 [id number]  Referring provider: [name], MD [facility] [address] [place], [place] [address]  Chief Complaint  Patient presents with  . Urinary Incontinence  . Recurrent UTI    HPI: She leaks with coughing and sneezing but not bending and lifting. She reports a stress component is most significant. She has mild bedwetting. She can soak 8 pads per day  She voids every 2-3 hours and used a 3 times a night.  She reports 7 bladder infections a year that responded favorably to antibiotics. She has typical cystitis symptoms.  By history she is failed Vesicare and Detrol and perhaps other antimuscarinics. She is currently on a beta 3 agonist each helped initially.  She is morbidly obese and recently went on home oxygen., Based on pulmonary issues and obesity she was not a good surgical candidate.   I would then consider urodynamics. I urethral injectable is another potential option if one was going to treat her outlet. Percutaneous tibial nerve stimulation is another potential option she primarily has an overactive bladder  Pelvic examination was somewhat limited due to obesity and labia. She did have what appeared to be mild grade 2 hypermobility the bladder neck and native cough test. She had no significant prolapse  She underwent cystoscopy after written consent. She had mild cystitis cystica. The urine was clear. She had no obvious acute cystitis. The trigone was normal. She was a bit tender. There was no carcinoma. The urinalysis was negative    Today The patient has been on trimethoprim hopefully do down regulate her incontinence. The patient reports less incontinence and less frequency. She thought she had 2 infections but when I asked her she really did not have any specifics. She may have a little bit of dysuria today so we sent a urine for culture. She says she's quite a bit better but her pad count was still high. Frequency  stable     PMH: Past Medical History:  Diagnosis Date  . Asthma   . COPD (chronic obstructive pulmonary disease) (HCC)   . Migraines   . Obesity, morbid, BMI 50 or higher (HCC) 09/25/2015  . Overactive bladder   . Sleep apnea     Surgical History: Past Surgical History:  Procedure Laterality Date  . ABDOMINAL HYSTERECTOMY      Home Medications:  Allergies as of 09/28/2016      Reactions   Penicillins Itching      Medication List       Accurate as of 09/28/16  9:07 AM. Always use your most recent med list.          albuterol 108 (90 Base) MCG/ACT inhaler Commonly known as:  PROVENTIL HFA;VENTOLIN HFA Inhale into the lungs every 6 (six) hours as needed for wheezing or shortness of breath.   albuterol (2.5 MG/3ML) 0.083% nebulizer solution Commonly known as:  PROVENTIL Take 2.5 mg by nebulization every 6 (six) hours as needed for wheezing or shortness of breath.   benzonatate 200 MG capsule Commonly known as:  TESSALON Take 200 mg by mouth 3 (three) times daily as needed for cough.   cyclobenzaprine 5 MG tablet Commonly known as:  FLEXERIL Take 1 tablet (5 mg total) by mouth 3 (three) times daily as needed for muscle spasms.   ergocalciferol 50000 units capsule Commonly known as:  VITAMIN D2 Take 50,000 Units by mouth once a week.   fluticasone 50 MCG/ACT nasal spray Commonly known as:  FLONASE  Place into both nostrils daily.   Fluticasone-Salmeterol 250-50 MCG/DOSE Aepb Commonly known as:  ADVAIR Inhale 1 puff into the lungs 2 (two) times daily.   ketorolac 10 MG tablet Commonly known as:  TORADOL Take 1 tablet (10 mg total) by mouth every 8 (eight) hours.   lidocaine 5 % Commonly known as:  LIDODERM Place 1 patch onto the skin every 12 (twelve) hours. Remove & Discard patch within 12 hours or as directed by MD   meloxicam 15 MG tablet Commonly known as:  MOBIC Take 1 tablet (15 mg total) by mouth daily.   methocarbamol 500 MG tablet Commonly  known as:  ROBAXIN Take 500 mg by mouth 3 (three) times daily.   montelukast 10 MG tablet Commonly known as:  SINGULAIR Take 10 mg by mouth at bedtime.   OXYGEN Inhale 2 L into the lungs.   OXYQUINOLONE SULFATE VAGINAL 0.025 % Gel Commonly known as:  TRIMO-SAN Place 0.025 mg vaginally 2 (two) times a week.   predniSONE 10 MG tablet Commonly known as:  DELTASONE Take 10 mg by mouth daily with breakfast.   PROTONIX 40 MG tablet Generic drug:  pantoprazole Take 40 mg by mouth daily.   SUMAtriptan 50 MG tablet Commonly known as:  IMITREX Take 2 tablets (100 mg total) by mouth once as needed for migraine. May repeat in 2 hours if headache persists or recurs. Do not exceed 200 mg per day.   tiotropium 18 MCG inhalation capsule Commonly known as:  SPIRIVA Place 18 mcg into inhaler and inhale daily.   traMADol 50 MG tablet Commonly known as:  ULTRAM Take 1 tablet (50 mg total) by mouth every 6 (six) hours as needed.   trimethoprim 100 MG tablet Commonly known as:  TRIMPEX Take 1 tablet (100 mg total) by mouth daily.       Allergies:  Allergies  Allergen Reactions  . Penicillins Itching    Family History: History reviewed. No pertinent family history.  Social History:  reports that she has never smoked. She has never used smokeless tobacco. She reports that she does not drink alcohol or use drugs.  ROS: UROLOGY Frequent Urination?: Yes Hard to postpone urination?: No Burning/pain with urination?: Yes Get up at night to urinate?: Yes Leakage of urine?: Yes Urine stream starts and stops?: No Trouble starting stream?: No Do you have to strain to urinate?: No Blood in urine?: No Urinary tract infection?: Yes Sexually transmitted disease?: No Injury to kidneys or bladder?: No Painful intercourse?: No Weak stream?: No Currently pregnant?: No Vaginal bleeding?: No Last menstrual period?: n  Gastrointestinal Nausea?: No Vomiting?: No Indigestion/heartburn?:  No Diarrhea?: No Constipation?: No  Constitutional Fever: No Night sweats?: No Weight loss?: No Fatigue?: No  Skin Skin rash/lesions?: Yes Itching?: Yes  Eyes Blurred vision?: Yes Double vision?: No  Ears/Nose/Throat Sore throat?: No Sinus problems?: No  Hematologic/Lymphatic Swollen glands?: No Easy bruising?: No  Cardiovascular Leg swelling?: Yes Chest pain?: No  Respiratory Cough?: No Shortness of breath?: Yes  Endocrine Excessive thirst?: Yes  Musculoskeletal Back pain?: Yes Joint pain?: Yes  Neurological Headaches?: Yes Dizziness?: No  Psychologic Depression?: No Anxiety?: No  Physical Exam: There were no vitals taken for this visit.  Constitutional:  Alert and oriented, No acute distress.   Laboratory Data: Lab Results  Component Value Date   WBC 10.4 09/18/2016   HGB 14.5 09/18/2016   HCT 42.7 09/18/2016   MCV 87.3 09/18/2016   PLT 205 09/18/2016    Lab  Results  Component Value Date   CREATININE 0.78 09/18/2016    No results found for: PSA  No results found for: TESTOSTERONE  No results found for: HGBA1C  Urinalysis    Component Value Date/Time   COLORURINE YELLOW (A) 09/18/2016 2040   APPEARANCEUR CLEAR (A) 09/18/2016 2040   APPEARANCEUR Hazy (A) 06/08/2016 1127   LABSPEC 1.029 09/18/2016 2040   LABSPEC 1.018 03/11/2013 1944   PHURINE 5.0 09/18/2016 2040   GLUCOSEU NEGATIVE 09/18/2016 2040   GLUCOSEU Negative 03/11/2013 1944   HGBUR NEGATIVE 09/18/2016 2040   BILIRUBINUR SMALL (A) 09/18/2016 2040   BILIRUBINUR Negative 06/08/2016 1127   BILIRUBINUR Negative 03/11/2013 1944   KETONESUR NEGATIVE 09/18/2016 2040   PROTEINUR 30 (A) 09/18/2016 2040   UROBILINOGEN negative 09/24/2015 1630   NITRITE NEGATIVE 09/18/2016 2040   LEUKOCYTESUR NEGATIVE 09/18/2016 2040   LEUKOCYTESUR Negative 06/08/2016 1127   LEUKOCYTESUR 3+ 03/11/2013 1944    Pertinent Imaging: none  Assessment & Plan:  I want to reassess her in 2  months on daily trimethoprim before I restart any overactive bladder medication. She was happy with this plan. We will call the culture is positive  There are no diagnoses linked to this encounter.  No Follow-up on file.  Martina Sinner, MD  [facility] [address] [place], [place] [address] [phone]

## 2016-09-30 LAB — CULTURE, URINE COMPREHENSIVE

## 2016-10-29 ENCOUNTER — Emergency Department: Payer: Medicaid Other

## 2016-10-29 ENCOUNTER — Encounter: Payer: Self-pay | Admitting: Emergency Medicine

## 2016-10-29 ENCOUNTER — Emergency Department
Admission: EM | Admit: 2016-10-29 | Discharge: 2016-10-29 | Disposition: A | Discharge status: Home / Self Care | Payer: Medicaid Other | Attending: Emergency Medicine | Admitting: Emergency Medicine

## 2016-10-29 DIAGNOSIS — R2242 Localized swelling, mass and lump, left lower limb: Secondary | ICD-10-CM | POA: Diagnosis present

## 2016-10-29 DIAGNOSIS — R600 Localized edema: Secondary | ICD-10-CM | POA: Insufficient documentation

## 2016-10-29 DIAGNOSIS — J449 Chronic obstructive pulmonary disease, unspecified: Secondary | ICD-10-CM | POA: Diagnosis not present

## 2016-10-29 DIAGNOSIS — Z79899 Other long term (current) drug therapy: Secondary | ICD-10-CM | POA: Diagnosis not present

## 2016-10-29 DIAGNOSIS — J45909 Unspecified asthma, uncomplicated: Secondary | ICD-10-CM | POA: Insufficient documentation

## 2016-10-29 DIAGNOSIS — R609 Edema, unspecified: Secondary | ICD-10-CM

## 2016-10-29 MED ORDER — TRAMADOL HCL 50 MG PO TABS: 50.0000 mg | ORAL_TABLET | Freq: Four times a day (QID) | ORAL | 0 refills | Status: DC | PRN

## 2016-10-29 NOTE — ED Notes (Signed)
Normal Pulses are present in pt left foot and marked

## 2016-10-29 NOTE — ED Notes (Signed)
First nurse note   Sent over from PCP with left left pain and swelling

## 2016-10-29 NOTE — ED Triage Notes (Signed)
Present with left leg swelling and pain  Sent in from pcp for possible DVT

## 2016-10-29 NOTE — ED Provider Notes (Signed)
[facility] Emergency Department Provider Note ____________________________________________  Time seen: Approximately 12:58 PM  I have reviewed the triage vital signs and the nursing notes.   HISTORY  Chief Complaint Leg Swelling    HPI Junie PanningJulie Ann Balin is a 51 y.o. female who presents to the emergency department for evaluation of left lower extremity pain. She states that she pulled a tick off of her leg a few days ago and has had pain and swelling since. She was evaluated by her PCP who suspected a DVT and sent her to the ER. She states leg swelling goes down a little overnight, but returns after she gets up and walks around. She has taken Ibuprofen without relief of pain. She denies injury.   Past Medical History:  Diagnosis Date  . Asthma   . COPD (chronic obstructive pulmonary disease) (HCC)   . Migraines   . Obesity, morbid, BMI 50 or higher (HCC) 09/25/2015  . Overactive bladder   . Sleep apnea     Patient Active Problem List   Diagnosis Date Noted  . Mixed incontinence 05/14/2016  . Cystocele with rectocele 09/25/2015  . Obesity, morbid, BMI 50 or higher (HCC) 09/25/2015  . Continuous leakage of urine 09/25/2015    Past Surgical History:  Procedure Laterality Date  . ABDOMINAL HYSTERECTOMY      Prior to Admission medications   Medication Sig Start Date End Date Taking? Authorizing Provider  albuterol (PROVENTIL HFA;VENTOLIN HFA) 108 (90 Base) MCG/ACT inhaler Inhale into the lungs every 6 (six) hours as needed for wheezing or shortness of breath.    [provider]  albuterol (PROVENTIL) (2.5 MG/3ML) 0.083% nebulizer solution Take 2.5 mg by nebulization every 6 (six) hours as needed for wheezing or shortness of breath.    [provider]  benzonatate (TESSALON) 200 MG capsule Take 200 mg by mouth 3 (three) times daily as needed for cough.    [provider]  cyclobenzaprine (FLEXERIL) 5 MG tablet Take 1 tablet (5  mg total) by mouth 3 (three) times daily as needed for muscle spasms. 02/01/16   Menshew, Charlesetta IvoryJenise V Bacon, PA-C  ergocalciferol (VITAMIN D2) 50000 units capsule Take 50,000 Units by mouth once a week.    [provider]  fluticasone (FLONASE) 50 MCG/ACT nasal spray Place into both nostrils daily.    [provider]  Fluticasone-Salmeterol (ADVAIR) 250-50 MCG/DOSE AEPB Inhale 1 puff into the lungs 2 (two) times daily.    [provider]  ketorolac (TORADOL) 10 MG tablet Take 1 tablet (10 mg total) by mouth every 8 (eight) hours. 02/01/16   Menshew, Charlesetta IvoryJenise V Bacon, PA-C  lidocaine (LIDODERM) 5 % Place 1 patch onto the skin every 12 (twelve) hours. Remove & Discard patch within 12 hours or as directed by MD 09/19/16 09/19/17  Rebecka ApleyWebster, Allison P, MD  meloxicam (MOBIC) 15 MG tablet Take 1 tablet (15 mg total) by mouth daily. 09/19/16 09/19/17  Rebecka ApleyWebster, Allison P, MD  methocarbamol (ROBAXIN) 500 MG tablet Take 500 mg by mouth 3 (three) times daily.    [provider]  montelukast (SINGULAIR) 10 MG tablet Take 10 mg by mouth at bedtime.    [provider]  OXYGEN Inhale 2 L into the lungs.    [provider]  OXYQUINOLONE SULFATE VAGINAL (TRIMO-SAN) 0.025 % GEL Place 0.025 mg vaginally 2 (two) times a week. 01/29/16   Hildred Laserherry, Anika, MD  pantoprazole (PROTONIX) 40 MG tablet Take 40 mg by mouth daily.  [provider]  predniSONE (DELTASONE) 10 MG tablet Take 10 mg by mouth daily with breakfast.    [provider]  SUMAtriptan (IMITREX) 50 MG tablet Take 2 tablets (100 mg total) by mouth once as needed for migraine. May repeat in 2 hours if headache persists or recurs. Do not exceed 200 mg per day. 05/27/15   Cuthriell, Delorise Royals, PA-C  tiotropium (SPIRIVA) 18 MCG inhalation capsule Place 18 mcg into inhaler and inhale daily.    [provider]  traMADol (ULTRAM) 50 MG tablet Take 1 tablet (50 mg total) by mouth every 6 (six) hours as  needed. 10/29/16   Llewellyn Schoenberger B, FNP  trimethoprim (TRIMPEX) 100 MG tablet Take 1 tablet (100 mg total) by mouth daily. 06/08/16   Alfredo Martinez, MD    Allergies Penicillins  No family history on file.  Social History Social History  Substance Use Topics  . Smoking status: Never Smoker  . Smokeless tobacco: Never Used  . Alcohol use No    Review of Systems Constitutional: Well appearing Cardiovascular: Negative for temperature difference of left vs right lower extremity. Respiratory: Negative for cough or shortness of breath Musculoskeletal: Positive for left lower extremity pain. Skin: Negative for wound or lesion Neurological: Negative for sensation loss or motor deficit   ____________________________________________   PHYSICAL EXAM:  VITAL SIGNS: ED Triage Vitals  Enc Vitals Group     BP 10/29/16 1119 122/73     Pulse Rate 10/29/16 1119 89     Resp 10/29/16 1119 20     Temp 10/29/16 1119 98.4 F (36.9 C)     Temp Source 10/29/16 1119 Oral     SpO2 10/29/16 1119 98 %     Weight 10/29/16 1105 300 lb (136.1 kg)     Height 10/29/16 1119 5\' 7"  (1.702 m)     Head Circumference --      Peak Flow --      Pain Score 10/29/16 1102 7     Pain Loc --      Pain Edu? --      Excl. in GC? --     Constitutional: Alert and oriented. Well appearing and in no acute distress. Eyes: Conjunctiva clear without drainage or discharge.  Head: Atraumatic Neck: No stridor. Full ROM Respiratory: Respirations even and unlabored. Musculoskeletal: Full ROM of left knee and ankle with nonpitting edema.  Neurologic: Good motor and sensation  Skin: Warm, dry, intact   ____________________________________________   LABS (all labs ordered are listed, but only abnormal results are displayed)  Labs Reviewed - No data to display ____________________________________________  RADIOLOGY  [place] of LLE without evidence of  DVT. ____________________________________________   PROCEDURES  Procedure(s) performed: None  ____________________________________________   INITIAL IMPRESSION / ASSESSMENT AND PLAN / ED COURSE  Eladia Frame is a 51 y.o. female who presents to the emergency department for evaluation of left lower extremity pain and swelling. No evidence of DVT. She was given a prescription for Tramadol and advised to follow up with her PCP.  Pertinent labs & imaging results that were available during my care of the patient were reviewed by me and considered in my medical decision making (see chart for details).  _________________________________________   FINAL CLINICAL IMPRESSION(S) / ED DIAGNOSES  Final diagnoses:  Peripheral edema    Discharge Medication List as of 10/29/2016  1:16 PM      If controlled substance prescribed during this visit, 12 month history viewed on the [facility] prior  to issuing an initial prescription for Schedule II or III opiod.    Chinita Pester, FNP 10/29/16 1512    Jeanmarie Plant, MD 10/29/16 [id number]

## 2016-11-08 ENCOUNTER — Ambulatory Visit
Admission: RE | Admit: 2016-11-08 | Discharge: 2016-11-08 | Disposition: A | Discharge status: Home / Self Care | Payer: Medicaid Other | Source: Ambulatory Visit | Attending: Family Medicine | Admitting: Family Medicine

## 2016-11-08 ENCOUNTER — Other Ambulatory Visit: Payer: Self-pay | Admitting: Family Medicine

## 2016-11-08 DIAGNOSIS — R262 Difficulty in walking, not elsewhere classified: Secondary | ICD-10-CM | POA: Insufficient documentation

## 2016-11-08 DIAGNOSIS — M5136 Other intervertebral disc degeneration, lumbar region: Secondary | ICD-10-CM | POA: Insufficient documentation

## 2016-11-29 ENCOUNTER — Encounter: Payer: Self-pay | Admitting: Urology

## 2016-11-29 ENCOUNTER — Ambulatory Visit (INDEPENDENT_AMBULATORY_CARE_PROVIDER_SITE_OTHER): Payer: Medicaid Other | Admitting: Urology

## 2016-11-29 VITALS — BP 109/73 | HR 82 | Ht 67.0 in | Wt 350.0 lb

## 2016-11-29 DIAGNOSIS — N3946 Mixed incontinence: Secondary | ICD-10-CM

## 2016-11-29 DIAGNOSIS — N390 Urinary tract infection, site not specified: Secondary | ICD-10-CM

## 2016-11-29 LAB — URINALYSIS, COMPLETE
Bilirubin, UA: NEGATIVE
GLUCOSE, UA: NEGATIVE
KETONES UA: NEGATIVE
LEUKOCYTES UA: NEGATIVE
Nitrite, UA: NEGATIVE
RBC, UA: NEGATIVE
SPEC GRAV UA: 1.025 (ref 1.005–1.030)
Urobilinogen, Ur: 1 mg/dL (ref 0.2–1.0)
pH, UA: 6 (ref 5.0–7.5)

## 2016-11-29 LAB — BLADDER SCAN AMB NON-IMAGING: Scan Result: 0

## 2016-11-29 LAB — MICROSCOPIC EXAMINATION
BACTERIA UA: NONE SEEN
WBC, UA: NONE SEEN /hpf (ref 0–?)

## 2016-11-29 MED ORDER — SOLIFENACIN SUCCINATE 5 MG PO TABS: 5.0000 mg | ORAL_TABLET | Freq: Every day | ORAL | 11 refills | Status: AC

## 2016-11-29 NOTE — Progress Notes (Signed)
11/29/2016 9:23 AM   Sheri PiccoloJulie Ann Carter 01/13/1966 [id number]  Referring provider: Evelene CroonNiemeyer, Meindert, MD [facility] [address] [place], [place] [id number]  Chief Complaint  Patient presents with  . Urinary Incontinence    HPI: She leaks with coughing and sneezing but not bending and lifting. She reports a stress component is most significant. She has mild bedwetting. She can soak 8 pads per day  She voids every 2-3 hours and used a 3 times a night.  She reports 7 bladder infections a year that responded favorably to antibiotics. She has typical cystitis symptoms.  By history she is failed Vesicare and Detrol and perhaps other antimuscarinics.   She is morbidly obese and recently went on home oxygen., Based on pulmonary issues and obesity she was not a good surgical candidate.   I would then consider urodynamics. I urethral injectable is another potential option if one was going to treat her outlet. Percutaneous tibial nerve stimulation is another potential option she primarily has an overactive bladder  Pelvic examination was somewhat limited due to obesity and labia. She did have what appeared to be mild grade 2 hypermobility the bladder neck and native cough test. She had no significant prolapse  She underwent cystoscopy after written consent. She had mild cystitis cystica.   The patient has been on trimethoprim hopefully do down regulate her incontinence. The patient reports less incontinence and less frequency. She thought she had 2 infections but when I asked her she really did not have any specifics.  She says she's quite a bit better but her pad count was still high.   Today Urine culture on the last visit was negative Mixed incontinence continues. She can leak trying to get to the restroom. Frequency stable. Clinically no infections   PMH: Past Medical History:  Diagnosis Date  . Asthma   . COPD (chronic obstructive pulmonary disease) (HCC)   . Migraines   . Obesity,  morbid, BMI 50 or higher (HCC) 09/25/2015  . Overactive bladder   . Sleep apnea     Surgical History: Past Surgical History:  Procedure Laterality Date  . ABDOMINAL HYSTERECTOMY      Home Medications:  Allergies as of 11/29/2016      Reactions   Penicillins Itching      Medication List       Accurate as of 11/29/16  9:23 AM. Always use your most recent med list.          albuterol 108 (90 Base) MCG/ACT inhaler Commonly known as:  PROVENTIL HFA;VENTOLIN HFA Inhale into the lungs every 6 (six) hours as needed for wheezing or shortness of breath.   albuterol (2.5 MG/3ML) 0.083% nebulizer solution Commonly known as:  PROVENTIL Take 2.5 mg by nebulization every 6 (six) hours as needed for wheezing or shortness of breath.   aspirin EC 81 MG tablet Take 81 mg by mouth daily.   benzonatate 200 MG capsule Commonly known as:  TESSALON Take 200 mg by mouth 3 (three) times daily as needed for cough.   cyclobenzaprine 5 MG tablet Commonly known as:  FLEXERIL Take 1 tablet (5 mg total) by mouth 3 (three) times daily as needed for muscle spasms.   doxycycline 100 MG EC tablet Commonly known as:  DORYX Take 100 mg by mouth 2 (two) times daily.   ergocalciferol 50000 units capsule Commonly known as:  VITAMIN D2 Take 50,000 Units by mouth once a week.   fluticasone 50 MCG/ACT nasal spray Commonly known as:  FLONASE  Place into both nostrils daily.   Fluticasone-Salmeterol 250-50 MCG/DOSE Aepb Commonly known as:  ADVAIR Inhale 1 puff into the lungs 2 (two) times daily.   ketorolac 10 MG tablet Commonly known as:  TORADOL Take 1 tablet (10 mg total) by mouth every 8 (eight) hours.   lidocaine 5 % Commonly known as:  LIDODERM Place 1 patch onto the skin every 12 (twelve) hours. Remove & Discard patch within 12 hours or as directed by MD   meloxicam 15 MG tablet Commonly known as:  MOBIC Take 1 tablet (15 mg total) by mouth daily.   metFORMIN 500 MG tablet Commonly  known as:  GLUCOPHAGE Take 1,000 mg by mouth daily with breakfast.   methocarbamol 500 MG tablet Commonly known as:  ROBAXIN Take 500 mg by mouth 3 (three) times daily.   montelukast 10 MG tablet Commonly known as:  SINGULAIR Take 10 mg by mouth at bedtime.   OXYGEN Inhale 2 L into the lungs.   OXYQUINOLONE SULFATE VAGINAL 0.025 % Gel Commonly known as:  TRIMO-SAN Place 0.025 mg vaginally 2 (two) times a week.   phentermine 37.5 MG capsule Take 37.5 mg by mouth every morning.   predniSONE 10 MG tablet Commonly known as:  DELTASONE Take 10 mg by mouth daily with breakfast.   PROTONIX 40 MG tablet Generic drug:  pantoprazole Take 40 mg by mouth daily.   psyllium 58.6 % powder Commonly known as:  METAMUCIL Take 1 packet by mouth 3 (three) times daily.   SUMAtriptan 50 MG tablet Commonly known as:  IMITREX Take 2 tablets (100 mg total) by mouth once as needed for migraine. May repeat in 2 hours if headache persists or recurs. Do not exceed 200 mg per day.   tiotropium 18 MCG inhalation capsule Commonly known as:  SPIRIVA Place 18 mcg into inhaler and inhale daily.   traMADol 50 MG tablet Commonly known as:  ULTRAM Take 1 tablet (50 mg total) by mouth every 6 (six) hours as needed.   trimethoprim 100 MG tablet Commonly known as:  TRIMPEX Take 1 tablet (100 mg total) by mouth daily.       Allergies:  Allergies  Allergen Reactions  . Penicillins Itching    Family History: No family history on file.  Social History:  reports that she has never smoked. She has never used smokeless tobacco. She reports that she does not drink alcohol or use drugs.  ROS: UROLOGY Frequent Urination?: Yes Hard to postpone urination?: No Burning/pain with urination?: No Get up at night to urinate?: Yes Leakage of urine?: Yes Urine stream starts and stops?: No Trouble starting stream?: No Do you have to strain to urinate?: No Blood in urine?: No Urinary tract infection?:  Yes Sexually transmitted disease?: No Injury to kidneys or bladder?: No Painful intercourse?: No Weak stream?: No Currently pregnant?: No Vaginal bleeding?: No Last menstrual period?: n  Gastrointestinal Nausea?: No Vomiting?: No Indigestion/heartburn?: No Diarrhea?: No Constipation?: No  Constitutional Fever: No Night sweats?: No Weight loss?: No Fatigue?: No  Skin Skin rash/lesions?: No Itching?: Yes  Eyes Blurred vision?: Yes Double vision?: No  Ears/Nose/Throat Sore throat?: No Sinus problems?: No  Hematologic/Lymphatic Swollen glands?: No Easy bruising?: No  Cardiovascular Leg swelling?: No Chest pain?: No  Respiratory Cough?: No Shortness of breath?: Yes  Endocrine Excessive thirst?: Yes  Musculoskeletal Back pain?: Yes Joint pain?: No  Neurological Headaches?: No Dizziness?: No  Psychologic Depression?: Yes Anxiety?: No  Physical Exam: BP 109/73 (BP Location: Left  Arm, Patient Position: Sitting, Cuff Size: Normal)   Pulse 82   Ht 5\' 7"  (1.702 m)   Wt (!) 158.8 kg (350 lb)   BMI 54.82 kg/m    Laboratory Data: Lab Results  Component Value Date   WBC 10.4 09/18/2016   HGB 14.5 09/18/2016   HCT 42.7 09/18/2016   MCV 87.3 09/18/2016   PLT 205 09/18/2016    Lab Results  Component Value Date   CREATININE 0.78 09/18/2016    No results found for: PSA  No results found for: TESTOSTERONE  No results found for: HGBA1C  Urinalysis    Component Value Date/Time   COLORURINE YELLOW (A) 09/18/2016 2040   APPEARANCEUR Cloudy (A) 09/28/2016 0852   LABSPEC 1.029 09/18/2016 2040   LABSPEC 1.018 03/11/2013 1944   PHURINE 5.0 09/18/2016 2040   GLUCOSEU Negative 09/28/2016 0852   GLUCOSEU Negative 03/11/2013 1944   HGBUR NEGATIVE 09/18/2016 2040   BILIRUBINUR Negative 09/28/2016 0852   BILIRUBINUR Negative 03/11/2013 1944   KETONESUR NEGATIVE 09/18/2016 2040   PROTEINUR Negative 09/28/2016 0852   PROTEINUR 30 (A) 09/18/2016 2040    UROBILINOGEN negative 09/24/2015 1630   NITRITE Negative 09/28/2016 0852   NITRITE NEGATIVE 09/18/2016 2040   LEUKOCYTESUR Negative 09/28/2016 0852   LEUKOCYTESUR 3+ 03/11/2013 1944    Pertinent Imaging: none  Assessment & Plan:  Reassess on daily trimethoprim and Vesicare 5 mg samples and prescription in 4-6 weeks  1. Mixed incontinence  - Bladder Scan (Post Void Residual) in office  2. Recurrent UTI  - Urinalysis, Complete   No Follow-up on file.  Martina Sinner, MD  [name] [address] [place], [place] [address] [phone]

## 2017-01-10 ENCOUNTER — Ambulatory Visit: Payer: Medicaid Other | Admitting: Urology

## 2017-01-11 ENCOUNTER — Encounter: Payer: Self-pay | Admitting: *Deleted

## 2017-01-11 ENCOUNTER — Telehealth: Payer: Self-pay | Admitting: *Deleted

## 2017-01-11 NOTE — Telephone Encounter (Signed)
Left message for patient to call back. We have a referral from Dr.Niemeyers office for gallstones

## 2017-01-18 ENCOUNTER — Encounter: Payer: Self-pay | Admitting: General Surgery

## 2017-01-18 ENCOUNTER — Ambulatory Visit (INDEPENDENT_AMBULATORY_CARE_PROVIDER_SITE_OTHER): Payer: Medicaid Other | Admitting: General Surgery

## 2017-01-18 VITALS — BP 142/80 | HR 70 | Resp 14 | Ht 67.0 in | Wt 331.0 lb

## 2017-01-18 DIAGNOSIS — K8020 Calculus of gallbladder without cholecystitis without obstruction: Secondary | ICD-10-CM | POA: Diagnosis not present

## 2017-01-18 DIAGNOSIS — R1013 Epigastric pain: Secondary | ICD-10-CM | POA: Diagnosis not present

## 2017-01-18 DIAGNOSIS — R1012 Left upper quadrant pain: Secondary | ICD-10-CM | POA: Diagnosis not present

## 2017-01-18 NOTE — Patient Instructions (Signed)
Follow-up with primary care doctor to discuss weight reduction before further pursuing surgical options. Also discuss scheduling of colonoscopy.   Return as needed.

## 2017-01-18 NOTE — Progress Notes (Signed)
Patient ID: Sheri Carter, female   DOB: 05/31/1966, 51 y.o.   MRN: [id number]  Chief Complaint  Patient presents with  . Other    Abdominal pain    HPI Sheri Carter is a 51 y.o. female  here today for an evaluation of abdominal pain. Patient states she first noticed the pain about two years ago. Pain is in upper abdomen, favoring left side, and is nearly constant. The patient had an abdominal ultrasound done at [facility] which showed gallstones. Patient states she has had diarrhea for the last month, no blood noticed. Patient stated the pain is in the epigastric region and in the left upper abdomen and is more or less constant and worse when she is lying down. Pain has no correlation with the eating on the type of food. Pain does not radiate elsewhere Pt has never had a colonoscopy. Brother had colon cancer at age 23.   HPI  Past Medical History:  Diagnosis Date  . Asthma   . COPD (chronic obstructive pulmonary disease) (HCC)   . Migraines   . Obesity, morbid, BMI 50 or higher (HCC) 09/25/2015  . Overactive bladder   . Sleep apnea     Past Surgical History:  Procedure Laterality Date  . ABDOMINAL HYSTERECTOMY      Family History  Problem Relation Age of Onset  . Colon cancer Brother 84    Social History Social History  Substance Use Topics  . Smoking status: Never Smoker  . Smokeless tobacco: Never Used  . Alcohol use No    Allergies  Allergen Reactions  . Penicillins Itching    Current Outpatient Prescriptions  Medication Sig Dispense Refill  . albuterol (PROVENTIL HFA;VENTOLIN HFA) 108 (90 Base) MCG/ACT inhaler Inhale into the lungs every 6 (six) hours as needed for wheezing or shortness of breath.    Marland Kitchen albuterol (PROVENTIL) (2.5 MG/3ML) 0.083% nebulizer solution Take 2.5 mg by nebulization every 6 (six) hours as needed for wheezing or shortness of breath.    Marland Kitchen aspirin EC 81 MG tablet Take 81 mg by mouth daily.    . benzonatate (TESSALON) 200  MG capsule Take 200 mg by mouth 3 (three) times daily as needed for cough.    . cyclobenzaprine (FLEXERIL) 5 MG tablet Take 1 tablet (5 mg total) by mouth 3 (three) times daily as needed for muscle spasms. 15 tablet 0  . ergocalciferol (VITAMIN D2) 50000 units capsule Take 50,000 Units by mouth once a week.    . fluticasone (FLONASE) 50 MCG/ACT nasal spray Place into both nostrils daily.    . Fluticasone-Salmeterol (ADVAIR) 250-50 MCG/DOSE AEPB Inhale 1 puff into the lungs 2 (two) times daily.    Marland Kitchen ketorolac (TORADOL) 10 MG tablet Take 1 tablet (10 mg total) by mouth every 8 (eight) hours. 15 tablet 0  . lidocaine (LIDODERM) 5 % Place 1 patch onto the skin every 12 (twelve) hours. Remove & Discard patch within 12 hours or as directed by MD 10 patch 0  . meloxicam (MOBIC) 15 MG tablet Take 1 tablet (15 mg total) by mouth daily. 10 tablet 0  . metFORMIN (GLUCOPHAGE) 500 MG tablet Take 1,000 mg by mouth daily with breakfast.    . methocarbamol (ROBAXIN) 500 MG tablet Take 500 mg by mouth 3 (three) times daily.    . montelukast (SINGULAIR) 10 MG tablet Take 10 mg by mouth at bedtime.    . NON FORMULARY     . OXYGEN Inhale 2 L  into the lungs.    Dani Gobble. OXYQUINOLONE SULFATE VAGINAL (TRIMO-SAN) 0.025 % GEL Place 0.025 mg vaginally 2 (two) times a week. 1 Tube 3  . pantoprazole (PROTONIX) 40 MG tablet Take 40 mg by mouth daily.    . phentermine 37.5 MG capsule Take 37.5 mg by mouth every morning.    . predniSONE (DELTASONE) 10 MG tablet Take 10 mg by mouth daily with breakfast.    . psyllium (METAMUCIL) 58.6 % powder Take 1 packet by mouth 3 (three) times daily.    . solifenacin (VESICARE) 5 MG tablet Take 1 tablet (5 mg total) by mouth daily. 30 tablet 11  . SUMAtriptan (IMITREX) 50 MG tablet Take 2 tablets (100 mg total) by mouth once as needed for migraine. May repeat in 2 hours if headache persists or recurs. Do not exceed 200 mg per day. 30 tablet 0  . tiotropium (SPIRIVA) 18 MCG inhalation capsule Place  18 mcg into inhaler and inhale daily.    . traMADol (ULTRAM) 50 MG tablet Take 1 tablet (50 mg total) by mouth every 6 (six) hours as needed. 12 tablet 0  . trimethoprim (TRIMPEX) 100 MG tablet Take 1 tablet (100 mg total) by mouth daily. 30 tablet 11  . doxycycline (DORYX) 100 MG EC tablet Take 100 mg by mouth 2 (two) times daily.     Current Facility-Administered Medications  Medication Dose Route Frequency Provider Last Rate Last Dose  . mirabegron ER (MYRBETRIQ) tablet 25 mg  25 mg Oral Daily Hildred Laserherry, Anika, MD        Review of Systems Review of Systems  Constitutional: Negative.   Respiratory: Negative.   Cardiovascular: Negative.   Gastrointestinal: Positive for diarrhea. Negative for nausea.    Blood pressure (!) 142/80, pulse 70, resp. rate 14, height 5\' 7"  (1.702 m), weight (!) 331 lb (150.1 kg).  Physical Exam Physical Exam  Constitutional: She is oriented to person, place, and time. She appears well-developed and well-nourished.  Obese female   Eyes: Conjunctivae are normal. No scleral icterus.  Neck: Neck supple.  Cardiovascular: Normal rate, regular rhythm and normal heart sounds.   Pulmonary/Chest: Effort normal and breath sounds normal.  Abdominal: Soft. Bowel sounds are normal. There is no hepatomegaly. There is tenderness. No hernia.  Non-focal tenderness along upper abdomen  Lymphadenopathy:    She has no cervical adenopathy.  Neurological: She is alert and oriented to person, place, and time.  Skin: Skin is warm and dry.    Data Reviewed Notes reviewed  CT scan done on 09/19/16 in [facility] shows no acute abnormality within the abdomen or pelvis; cholelithiasis, gallbladder otherwise unremarkable.   Assessment  Abdominal pain -pt does have confirmed gallstones, however, presentation is not consistent with gallstones/bilary colic alone, although this may be in addition to another problem. Pain is non-focal and located in the upper abdomen, not  aggravated by food or positioning. No associated nausea or fever. CT done in April 2018 showed no abdominal pathology. Complete etiology of pain unclear at this time.   Obesity - pt is at high risk for surgical complications due to weight. Do not recommend elective cholecystectomy at this time, would like pt to focus on weight loss and lifestyle changes before revisiting this option.   Family hx of colon cancer -brother diagnosed at young age (37), pt needs colonoscopy.     Plan   Follow-up with primary care doctor to discuss weight reduction before further pursuing surgical options. Also discuss scheduling of  colonoscopy.   Return as needed.  HPI, Physical Exam, Assessment and Plan have been scribed under the direction and in the presence of Kathreen Cosier, MD  Ples Specter, CMA    I have completed the exam and reviewed the above documentation for accuracy and completeness.  I agree with the above.  Museum/gallery conservator has been used and any errors in dictation or transcription are unintentional.  Savaya Hakes G. Evette Cristal, M.D., F.A.C.S.  Gerlene Burdock G 01/18/2017, 4:52 PM

## 2017-01-24 ENCOUNTER — Telehealth: Payer: Self-pay | Admitting: Urology

## 2017-01-24 NOTE — Telephone Encounter (Signed)
[facility] called and needs updated orders for pt per Medicaid.  She said she had faxed request twice.  I informed her MacDiarmid wasn't in office and wouldn't be back until mid-September.

## 2017-01-25 NOTE — Telephone Encounter (Signed)
Got another MD to sign forms and faxed back.

## 2017-07-23 IMAGING — CT CT RENAL STONE PROTOCOL
1 of 3 series · 14 of 32 positions shown, 19 images · non-contrast
Comparison: 07/07/2009

CLINICAL DATA: Recurrent urinary tract infections and pelvic pain.

EXAM:
CT ABDOMEN AND PELVIS WITHOUT CONTRAST
TECHNIQUE: Multidetector CT imaging of the abdomen and pelvis was performed
following the standard protocol without IV contrast.

[Series 2: axial st · axial · 0.86mm/px · z∈[-1115,-675]mm · 14 of 98 slices shown, 19 images]
[im 5/98  soft-tissue]
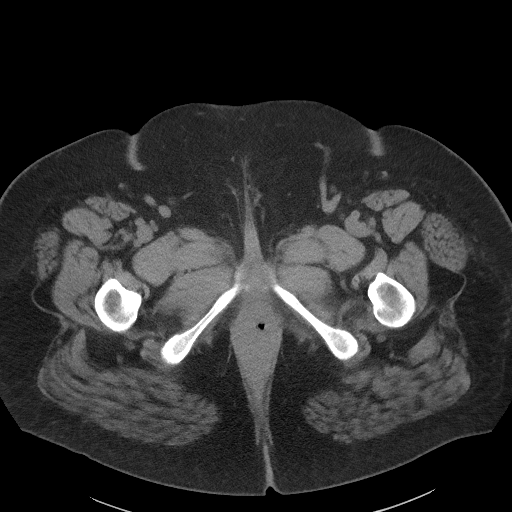
[im 5/98  bone]
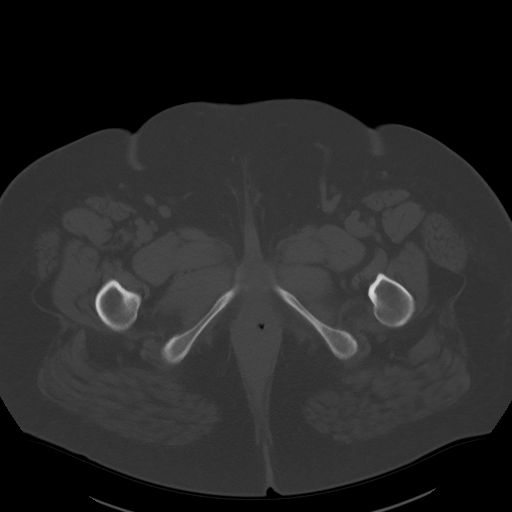
[im 14/98  soft-tissue]
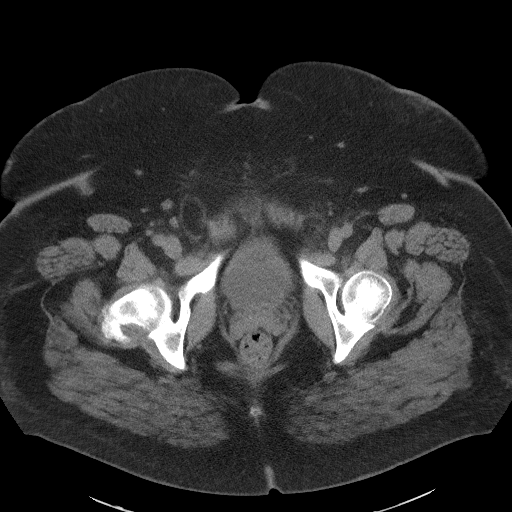
[im 19/98  soft-tissue]
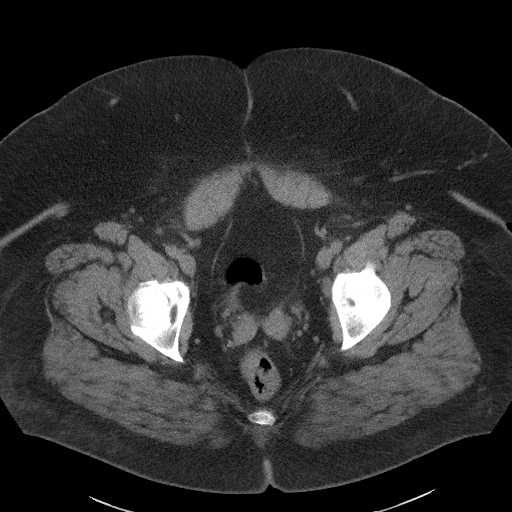
[im 28/98  soft-tissue]
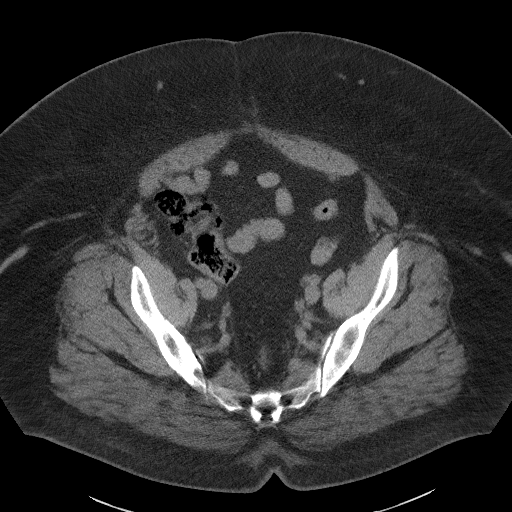
[im 33/98  soft-tissue]
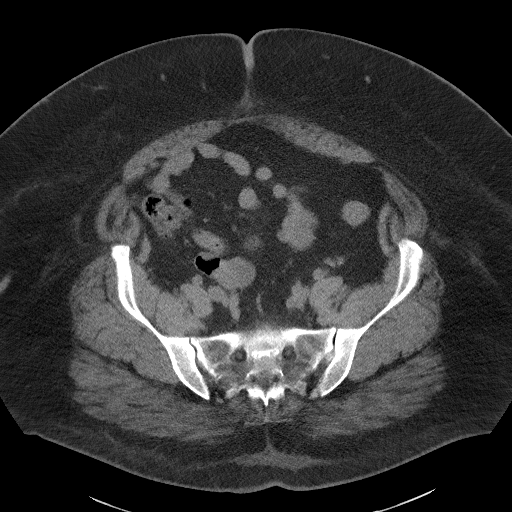
[im 42/98  soft-tissue]
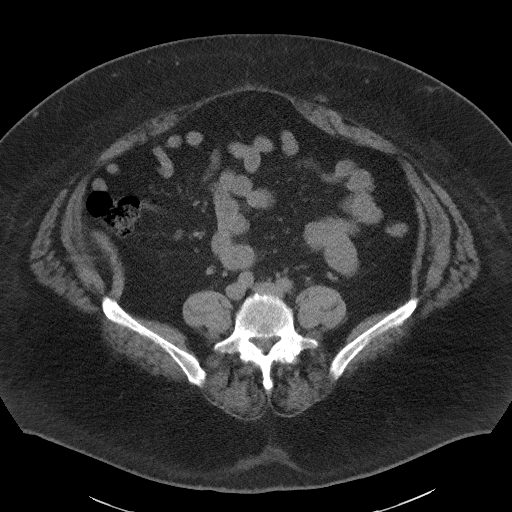
[im 51/98  soft-tissue]
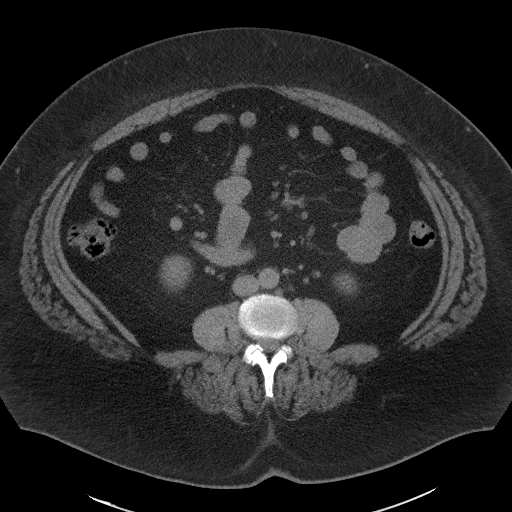
[im 56/98  soft-tissue]
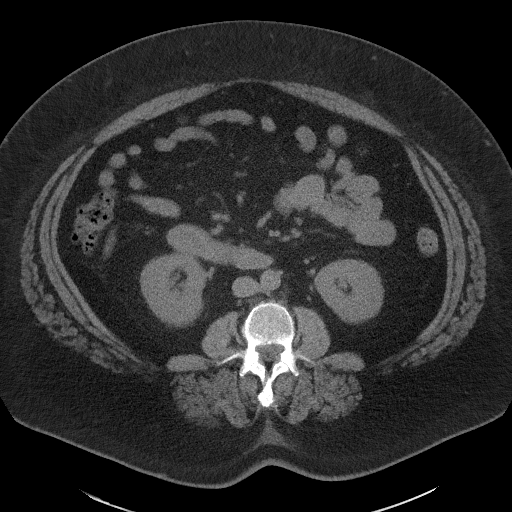
[im 65/98  soft-tissue]
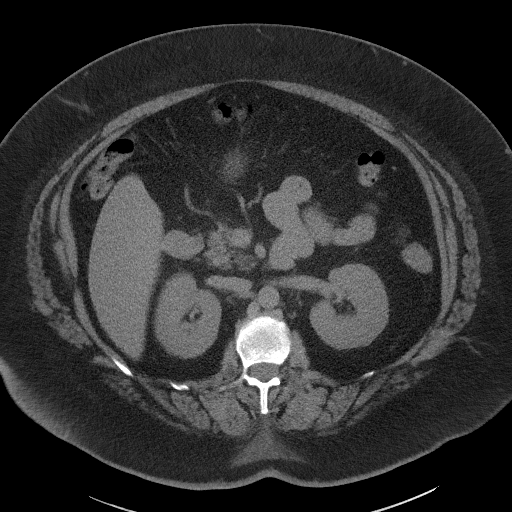
[im 65/98  bone]
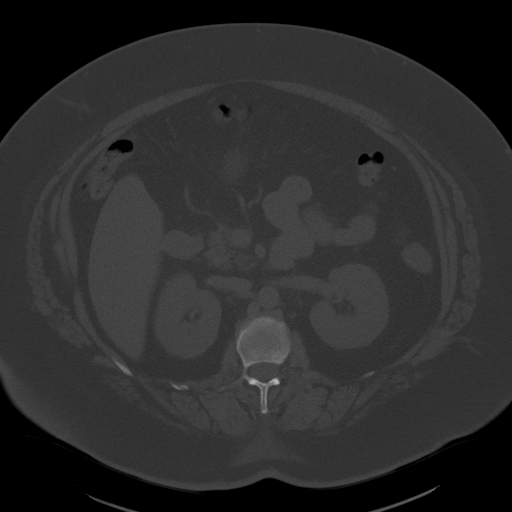
[im 70/98  soft-tissue]
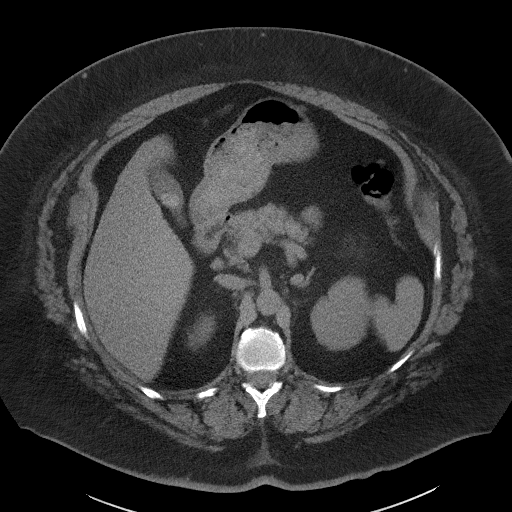
[im 79/98  soft-tissue]
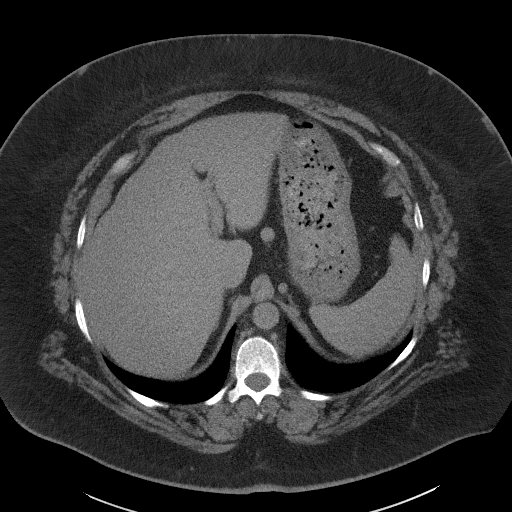
[im 79/98  lung]
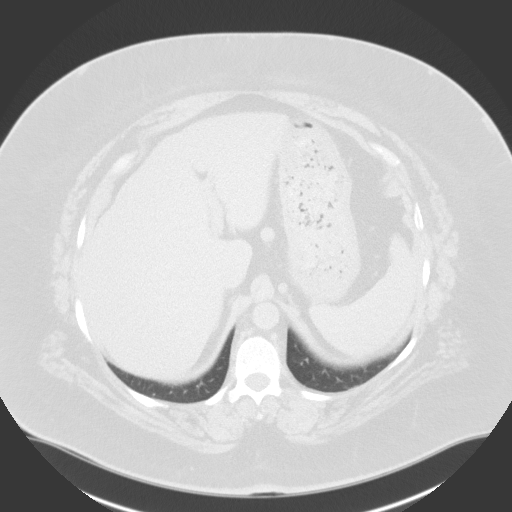
[im 84/98  soft-tissue]
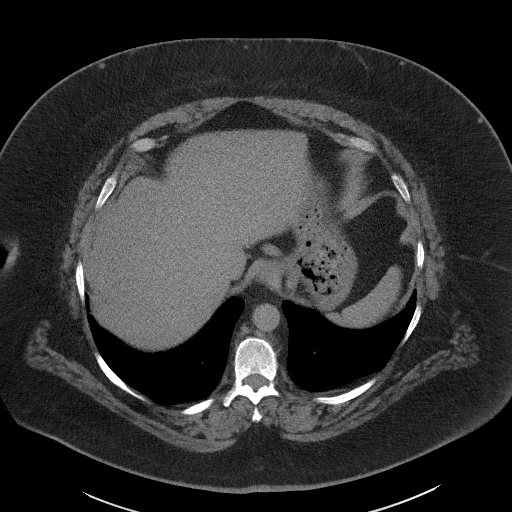
[im 84/98  lung]
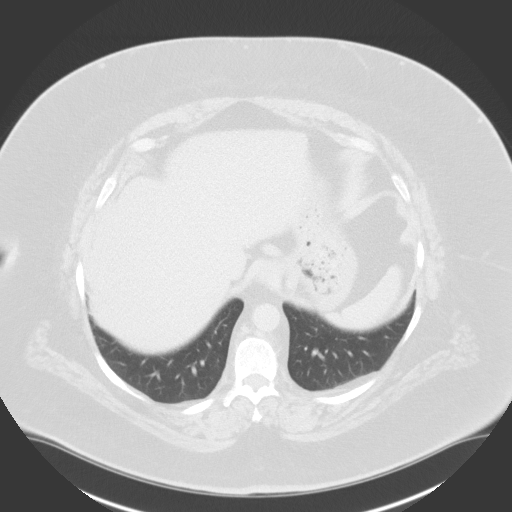
[im 88/98  lung]
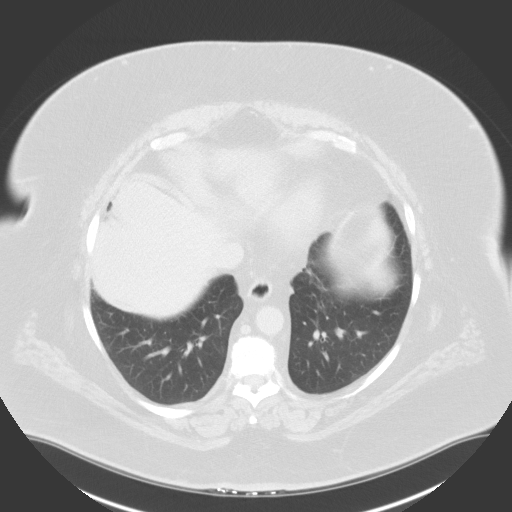
[im 93/98  soft-tissue]
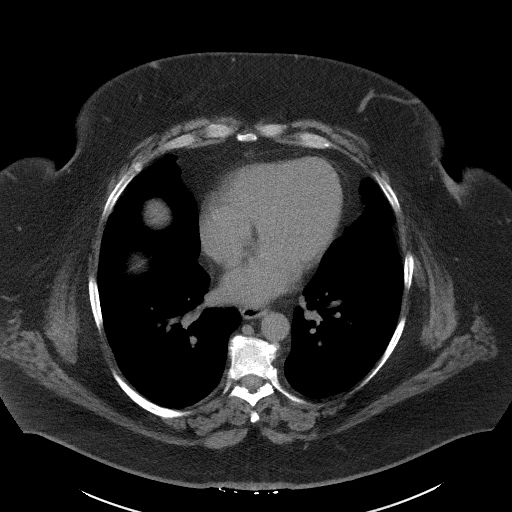
[im 93/98  lung]
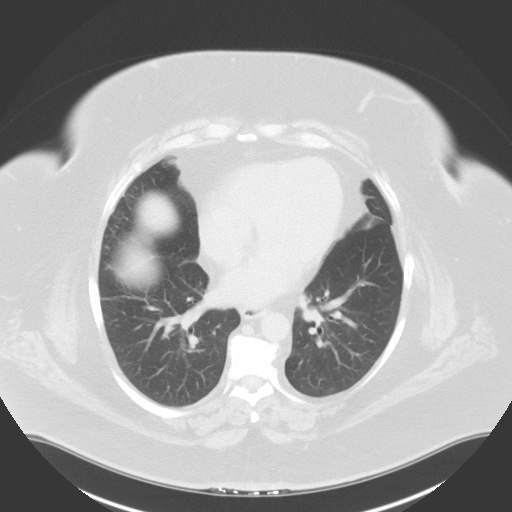

[14 of 32 positions shown; findings below may reference images not displayed]

FINDINGS: Lower chest: No acute findings. Several sub-cm subpleural pulmonary
nodules in anterior right lung base remains stable.

Hepatobiliary: Mild diffuse hepatic steatosis. No liver mass
visualized on this unenhanced exam. Cholelithiasis is demonstrated,
without evidence cholecystitis or biliary ductal dilatation.

Pancreas: No mass or inflammatory process visualized on this
unenhanced exam.

Spleen:  Within normal limits in size.

Adrenals/Urinary tract: No evidence of urolithiasis or
hydronephrosis. Unremarkable appearance of bladder.

Stomach/Bowel: No evidence of obstruction, inflammatory process, or
abnormal fluid collections.

Vascular/Lymphatic: No pathologically enlarged lymph nodes
identified. No evidence of abdominal aortic aneurysm.

Reproductive: Prior hysterectomy noted. Adnexal regions are
unremarkable in appearance.

Other:  None.

Musculoskeletal:  No suspicious bone lesions identified.
IMPRESSION: No evidence of urolithiasis, hydronephrosis, or other acute
findings.

Cholelithiasis.  No radiographic evidence of cholecystitis.

Mild hepatic steatosis.

## 2017-12-10 ENCOUNTER — Emergency Department: Payer: Medicaid Other

## 2017-12-10 ENCOUNTER — Encounter: Payer: Self-pay | Admitting: Emergency Medicine

## 2017-12-10 ENCOUNTER — Emergency Department
Admission: EM | Admit: 2017-12-10 | Discharge: 2017-12-10 | Disposition: A | Discharge status: Home / Self Care | Payer: Medicaid Other | Attending: Emergency Medicine | Admitting: Emergency Medicine

## 2017-12-10 DIAGNOSIS — J45909 Unspecified asthma, uncomplicated: Secondary | ICD-10-CM | POA: Insufficient documentation

## 2017-12-10 DIAGNOSIS — Z79899 Other long term (current) drug therapy: Secondary | ICD-10-CM | POA: Insufficient documentation

## 2017-12-10 DIAGNOSIS — M25561 Pain in right knee: Secondary | ICD-10-CM | POA: Insufficient documentation

## 2017-12-10 DIAGNOSIS — Z7982 Long term (current) use of aspirin: Secondary | ICD-10-CM | POA: Diagnosis not present

## 2017-12-10 DIAGNOSIS — Z7984 Long term (current) use of oral hypoglycemic drugs: Secondary | ICD-10-CM | POA: Insufficient documentation

## 2017-12-10 DIAGNOSIS — W19XXXA Unspecified fall, initial encounter: Secondary | ICD-10-CM

## 2017-12-10 DIAGNOSIS — J449 Chronic obstructive pulmonary disease, unspecified: Secondary | ICD-10-CM | POA: Diagnosis not present

## 2017-12-10 DIAGNOSIS — M25562 Pain in left knee: Secondary | ICD-10-CM | POA: Insufficient documentation

## 2017-12-10 MED ORDER — KETOROLAC TROMETHAMINE 10 MG PO TABS: 10.0000 mg | ORAL_TABLET | Freq: Four times a day (QID) | ORAL | 0 refills | Status: DC | PRN

## 2017-12-10 MED ORDER — KETOROLAC TROMETHAMINE 30 MG/ML IJ SOLN
30.0000 mg | Freq: Once | INTRAMUSCULAR | Status: AC
Start: 2017-12-10 — End: 2017-12-10
  Administered 2017-12-10: 30 mg via INTRAMUSCULAR
  Filled 2017-12-10: qty 1

## 2017-12-10 NOTE — ED Provider Notes (Addendum)
[facility] Emergency Department Provider Note  ____________________________________________  Time seen: Approximately 1:11 PM  I have reviewed the triage vital signs and the nursing notes.   HISTORY  Chief Complaint Fall    HPI Sheri Carter is a 52 y.o. female that presents to the the emergency department for evaluation of bilateral knee pain after falling yesterday.  Patient states that she got home with groceries yesterday and her pitbull was excited to see her and ran into both of her knees.  She proceeded to fall on her buttocks.  She is not having any back pain.  Both of her knees have been painful with walking since.  She has taken Tylenol for pain.  Ice helps. No additional injuries.  Past Medical History:  Diagnosis Date  . Asthma   . COPD (chronic obstructive pulmonary disease) (HCC)   . Migraines   . Obesity, morbid, BMI 50 or higher (HCC) 09/25/2015  . Overactive bladder   . Sleep apnea     Patient Active Problem List   Diagnosis Date Noted  . Mixed incontinence 05/14/2016  . Cystocele with rectocele 09/25/2015  . Obesity, morbid, BMI 50 or higher (HCC) 09/25/2015  . Continuous leakage of urine 09/25/2015    Past Surgical History:  Procedure Laterality Date  . ABDOMINAL HYSTERECTOMY      Prior to Admission medications   Medication Sig Start Date End Date Taking? Authorizing Provider  albuterol (PROVENTIL HFA;VENTOLIN HFA) 108 (90 Base) MCG/ACT inhaler Inhale into the lungs every 6 (six) hours as needed for wheezing or shortness of breath.    [provider]  albuterol (PROVENTIL) (2.5 MG/3ML) 0.083% nebulizer solution Take 2.5 mg by nebulization every 6 (six) hours as needed for wheezing or shortness of breath.    [provider]  aspirin EC 81 MG tablet Take 81 mg by mouth daily.    [provider]  benzonatate (TESSALON) 200 MG capsule Take 200 mg by mouth 3 (three) times daily as needed for cough.     [provider]  cyclobenzaprine (FLEXERIL) 5 MG tablet Take 1 tablet (5 mg total) by mouth 3 (three) times daily as needed for muscle spasms. 02/01/16   Menshew, Charlesetta Ivory, PA-C  doxycycline (DORYX) 100 MG EC tablet Take 100 mg by mouth 2 (two) times daily.    [provider]  ergocalciferol (VITAMIN D2) 50000 units capsule Take 50,000 Units by mouth once a week.    [provider]  fluticasone (FLONASE) 50 MCG/ACT nasal spray Place into both nostrils daily.    [provider]  Fluticasone-Salmeterol (ADVAIR) 250-50 MCG/DOSE AEPB Inhale 1 puff into the lungs 2 (two) times daily.    [provider]  ketorolac (TORADOL) 10 MG tablet Take 1 tablet (10 mg total) by mouth every 6 (six) hours as needed. 12/10/17   Enid Derry, PA-C  metFORMIN (GLUCOPHAGE) 500 MG tablet Take 1,000 mg by mouth daily with breakfast.    [provider]  methocarbamol (ROBAXIN) 500 MG tablet Take 500 mg by mouth 3 (three) times daily.    [provider]  montelukast (SINGULAIR) 10 MG tablet Take 10 mg by mouth at bedtime.    [provider]  NON FORMULARY     [provider]  OXYGEN Inhale 2 L into the lungs.    [provider]  OXYQUINOLONE SULFATE VAGINAL (TRIMO-SAN) 0.025 % GEL Place 0.025 mg vaginally 2 (two) times a week. 01/29/16   Hildred Laser, MD  pantoprazole (PROTONIX) 40 MG tablet Take 40 mg by mouth daily.    [provider]  phentermine 37.5 MG capsule Take 37.5 mg by mouth every morning.    [provider]  predniSONE (DELTASONE) 10 MG tablet Take 10 mg by mouth daily with breakfast.    [provider]  psyllium (METAMUCIL) 58.6 % powder Take 1 packet by mouth 3 (three) times daily.    [provider]  solifenacin (VESICARE) 5 MG tablet Take 1 tablet (5 mg total) by mouth daily. 11/29/16   Alfredo Martinez, MD  SUMAtriptan (IMITREX) 50 MG tablet Take 2 tablets (100 mg total) by  mouth once as needed for migraine. May repeat in 2 hours if headache persists or recurs. Do not exceed 200 mg per day. 05/27/15   Cuthriell, Delorise Royals, PA-C  tiotropium (SPIRIVA) 18 MCG inhalation capsule Place 18 mcg into inhaler and inhale daily.    [provider]  traMADol (ULTRAM) 50 MG tablet Take 1 tablet (50 mg total) by mouth every 6 (six) hours as needed. 10/29/16   Triplett, Cari B, FNP  trimethoprim (TRIMPEX) 100 MG tablet Take 1 tablet (100 mg total) by mouth daily. 06/08/16   Alfredo Martinez, MD    Allergies Penicillins  Family History  Problem Relation Age of Onset  . Colon cancer Brother 51    Social History Social History   Tobacco Use  . Smoking status: Never Smoker  . Smokeless tobacco: Never Used  Substance Use Topics  . Alcohol use: No  . Drug use: No     Review of Systems  Constitutional: No fever/chills Cardiovascular: No chest pain. Respiratory: No SOB. Gastrointestinal: No abdominal pain.  No nausea, no vomiting.  Musculoskeletal: Positive for knee pain.  Skin: Negative for rash, abrasions, lacerations, ecchymosis. Neurological: Negative for headaches, numbness or tingling   ____________________________________________   PHYSICAL EXAM:  VITAL SIGNS: ED Triage Vitals  Enc Vitals Group     BP 12/10/17 1200 135/68     Pulse Rate 12/10/17 1200 (!) 116     Resp 12/10/17 1200 16     Temp 12/10/17 1200 98.3 F (36.8 C)     Temp Source 12/10/17 1200 Oral     SpO2 12/10/17 1200 96 %     Weight 12/10/17 1157 300 lb (136.1 kg)     Height 12/10/17 1157 5\' 7"  (1.702 m)     Head Circumference --      Peak Flow --      Pain Score 12/10/17 1157 6     Pain Loc --      Pain Edu? --      Excl. in GC? --      Constitutional: Alert and oriented. Well appearing and in no acute distress. Eyes: Conjunctivae are normal. PERRL. EOMI. Head: Atraumatic. ENT:      Ears:      Nose: No congestion/rhinnorhea.      Mouth/Throat: Mucous membranes  are moist.  Neck: No stridor.  Cardiovascular: Normal rate, regular rhythm.  Good peripheral circulation. Respiratory: Normal respiratory effort without tachypnea or retractions. Lungs CTAB. Good air entry to the bases with no decreased or absent breath sounds. Musculoskeletal: Full range of motion to all extremities. No gross deformities appreciated.  Full range of motion to knees.  No significant tenderness to palpation.  No swelling or ecchymosis. Neurologic:  Normal speech and language. No gross focal neurologic deficits are appreciated.  Skin:  Skin is warm, dry and intact. No rash noted.  Psychiatric: Mood and affect are normal. Speech and behavior are normal. Patient exhibits appropriate insight and judgement.   ____________________________________________   LABS (all labs ordered are listed, but only abnormal results are displayed)  Labs Reviewed - No data to display ____________________________________________  EKG   ____________________________________________  RADIOLOGY Lexine BatonI, Kelseigh Diver, personally viewed and evaluated these images (plain radiographs) as part of my medical decision making, as well as reviewing the written report by the radiologist.  Dg Knee Complete 4 Views Left  Result Date: 12/10/2017 CLINICAL DATA:  Pain following fall EXAM: LEFT KNEE - COMPLETE 4+ VIEW COMPARISON:  None. FINDINGS: Frontal, lateral, and bilateral oblique views were obtained. There is no demonstrable fracture or dislocation. No appreciable joint effusion. There is moderate generalized joint space narrowing. There is spurring in all compartments. No erosive change. There is chondrocalcinosis. IMPRESSION: Generalized osteoarthritic change. No fracture or joint effusion evident. No dislocation. There is chondrocalcinosis, a finding that may be seen with osteoarthritis or with calcium pyrophosphate deposition disease. Electronically Signed   By: Bretta BangWilliam  Woodruff III M.D.   On: 12/10/2017 13:00    Dg Knee Complete 4 Views Right  Result Date: 12/10/2017 CLINICAL DATA:  Bilateral knee pain after injury EXAM: RIGHT KNEE - COMPLETE 4+ VIEW COMPARISON:  None. FINDINGS: No fracture, joint effusion or dislocation. Mild tricompartmental right knee osteoarthritis. Tiny inferior right patellar enthesophyte. No radiopaque foreign body. IMPRESSION: 1. No right knee fracture, joint effusion or dislocation. 2. Mild tricompartmental right knee osteoarthritis. Electronically Signed   By: Delbert PhenixJason A Poff M.D.   On: 12/10/2017 13:00    ____________________________________________    PROCEDURES  Procedure(s) performed:    Procedures    Medications  ketorolac (TORADOL) 30 MG/ML injection 30 mg (30 mg Intramuscular Given 12/10/17 1328)     ____________________________________________   INITIAL IMPRESSION / ASSESSMENT AND PLAN / ED COURSE  Pertinent labs & imaging results that were available during my care of the patient were reviewed by me and considered in my medical decision making (see chart for details).  Review of the [place] CSRS was performed in accordance of the [facility] prior to dispensing any controlled drugs.     Patient presented to emergency department for evaluation of knee pain after injury yesterday.  Vital signs and exam are reassuring.  X-ray negative for acute bony abnormalities.  X-ray consistent with osteoarthritis.  Findings were discussed with patient.  IM Toradol was given.  Knees were Ace wrapped.  Patient has a walker at home. Patient will be discharged home with prescriptions for toradol. Patient is to follow up with pediatrician as directed. Patient is given ED precautions to return to the ED for any worsening or new symptoms.     ____________________________________________  FINAL CLINICAL IMPRESSION(S) / ED DIAGNOSES  Final diagnoses:  Fall, initial encounter  Acute pain of both knees      NEW MEDICATIONS STARTED DURING THIS VISIT:  ED Discharge Orders         Ordered    ketorolac (TORADOL) 10 MG tablet  Every 6 hours PRN     12/10/17 1342          This chart was dictated using voice recognition software/Dragon. Despite best efforts to proofread, errors can occur which can change the meaning. Any change was purely unintentional.    Enid DerryWagner, Delvina Mizzell, PA-C 12/10/17 1452    Enid DerryWagner, Trevonne Nyland, PA-C 12/10/17 1453    Pershing ProudSchaevitz, Myra Rudeavid Matthew, MD 12/10/17 [phone]

## 2017-12-10 NOTE — ED Notes (Signed)
Pt reports being tackled by her 150lb dog yesterday, who ran right into her legs at full speed, knocking her backwards. Pt denies hitting head. No LOC. Pain in bilateral legs. States it is very painful, difficult to walk, difficult to straighten legs. Pt tender to distal right knee upon palpation by this RN.

## 2017-12-10 NOTE — ED Notes (Signed)
Pt ambulatory to wheel chair upon discharge. Verbalized understanding of discharge instructions, follow-up care and prescription. VSS. Skin warm and dry. A&O x4.  

## 2017-12-10 NOTE — ED Triage Notes (Signed)
Patient presents to the ED with bilateral leg pain after she fell.  Patient states she was walking her dog and her dog took off running and patient was knocked to the ground in the grass.  Patient states, "it hurt my legs bad."  Patient is in no obvious distress at this time.  Patient denies any other injury.  Denies hitting head or passing out.

## 2018-02-14 ENCOUNTER — Other Ambulatory Visit: Payer: Self-pay

## 2018-02-14 ENCOUNTER — Emergency Department (HOSPITAL_COMMUNITY)
Admission: EM | Admit: 2018-02-14 | Discharge: 2018-02-14 | Disposition: A | Discharge status: Home / Self Care | Payer: Medicaid Other | Attending: Emergency Medicine | Admitting: Emergency Medicine

## 2018-02-14 ENCOUNTER — Emergency Department (HOSPITAL_COMMUNITY): Payer: Medicaid Other

## 2018-02-14 ENCOUNTER — Encounter (HOSPITAL_COMMUNITY): Payer: Self-pay | Admitting: Emergency Medicine

## 2018-02-14 DIAGNOSIS — M19031 Primary osteoarthritis, right wrist: Secondary | ICD-10-CM | POA: Insufficient documentation

## 2018-02-14 DIAGNOSIS — M25531 Pain in right wrist: Secondary | ICD-10-CM | POA: Insufficient documentation

## 2018-02-14 DIAGNOSIS — Z79899 Other long term (current) drug therapy: Secondary | ICD-10-CM | POA: Diagnosis not present

## 2018-02-14 DIAGNOSIS — J449 Chronic obstructive pulmonary disease, unspecified: Secondary | ICD-10-CM | POA: Insufficient documentation

## 2018-02-14 DIAGNOSIS — Z7982 Long term (current) use of aspirin: Secondary | ICD-10-CM | POA: Diagnosis not present

## 2018-02-14 MED ORDER — KETOROLAC TROMETHAMINE 10 MG PO TABS
10.0000 mg | ORAL_TABLET | Freq: Once | ORAL | Status: AC
  Administered 2018-02-14: 10 mg via ORAL
  Filled 2018-02-14: qty 1

## 2018-02-14 MED ORDER — TRAMADOL HCL 50 MG PO TABS
100.0000 mg | ORAL_TABLET | Freq: Once | ORAL | Status: AC
  Administered 2018-02-14: 100 mg via ORAL
  Filled 2018-02-14: qty 2

## 2018-02-14 MED ORDER — MELOXICAM 7.5 MG PO TABS: 7.5000 mg | ORAL_TABLET | Freq: Every day | ORAL | 0 refills | Status: DC

## 2018-02-14 MED ORDER — ONDANSETRON HCL 4 MG PO TABS
4.0000 mg | ORAL_TABLET | Freq: Once | ORAL | Status: AC
  Administered 2018-02-14: 4 mg via ORAL
  Filled 2018-02-14: qty 1

## 2018-02-14 MED ORDER — TRAMADOL HCL 50 MG PO TABS: ORAL_TABLET | ORAL | 0 refills | Status: AC

## 2018-02-14 NOTE — Discharge Instructions (Addendum)
Vital signs within normal limits.  The x-ray of the wrist is negative for fracture, or dislocation, or bone lesion.  Your examination suggest an exacerbation of your arthritis.  Please see Dr. Romeo Apple or the orthopedic specialist of your choice for orthopedic evaluation and management of this problem.  Please use your wrist splint until seen by orthopedics.  Please use Mobic daily with food.  Use Tylenol extra strength every 4 hours.  May use Ultram every 6 hours if needed for more severe pain. This medication may cause drowsiness. Please do not drink, drive, or participate in activity that requires concentration while taking this medication.

## 2018-02-14 NOTE — ED Triage Notes (Signed)
Pt c/o R hand pain for two weeks, denies injury. Motrin with no relief at home. Full ROM.

## 2018-02-14 NOTE — ED Provider Notes (Signed)
[facility] EMERGENCY DEPARTMENT Provider Note   CSN: [id number] Arrival date & time: 02/14/18  1900     History   Chief Complaint Chief Complaint  Patient presents with  . Hand Pain    HPI Sheri Carter is a 52 y.o. female.  Patient is a 52 year old female who presents to the emergency department with a complaint of right hand and wrist pain.  The patient states that she has been having this problem for nearly 2 weeks.  She has pain in her hand and wrist.  She says she has problems with extreme stiffness in her fingers from time to time.  She has a history of arthritis of multiple sites.  She has not been evaluated for this pain in her wrist.  She is not dropping objects, but has pain with grip, has pain with movement of the wrist.  She has not had any injury or trauma to this area.  She has not noticed any hot joints.  She has not had any operations or procedures of this particular area.  The history is provided by the patient.    Past Medical History:  Diagnosis Date  . Asthma   . COPD (chronic obstructive pulmonary disease) (HCC)   . Migraines   . Obesity, morbid, BMI 50 or higher (HCC) 09/25/2015  . Overactive bladder   . Sleep apnea     Patient Active Problem List   Diagnosis Date Noted  . Mixed incontinence 05/14/2016  . Cystocele with rectocele 09/25/2015  . Obesity, morbid, BMI 50 or higher (HCC) 09/25/2015  . Continuous leakage of urine 09/25/2015    Past Surgical History:  Procedure Laterality Date  . ABDOMINAL HYSTERECTOMY       OB History    Gravida  2   Para      Term      Preterm      AB      Living  2     SAB      TAB      Ectopic      Multiple      Live Births               Home Medications    Prior to Admission medications   Medication Sig Start Date End Date Taking? Authorizing Provider  albuterol (PROVENTIL HFA;VENTOLIN HFA) 108 (90 Base) MCG/ACT inhaler Inhale into the lungs every 6 (six) hours as needed for  wheezing or shortness of breath.    [provider]  albuterol (PROVENTIL) (2.5 MG/3ML) 0.083% nebulizer solution Take 2.5 mg by nebulization every 6 (six) hours as needed for wheezing or shortness of breath.    [provider]  aspirin EC 81 MG tablet Take 81 mg by mouth daily.    [provider]  benzonatate (TESSALON) 200 MG capsule Take 200 mg by mouth 3 (three) times daily as needed for cough.    [provider]  cyclobenzaprine (FLEXERIL) 5 MG tablet Take 1 tablet (5 mg total) by mouth 3 (three) times daily as needed for muscle spasms. 02/01/16   Menshew, Charlesetta Ivory, PA-C  doxycycline (DORYX) 100 MG EC tablet Take 100 mg by mouth 2 (two) times daily.    [provider]  ergocalciferol (VITAMIN D2) 50000 units capsule Take 50,000 Units by mouth once a week.    [provider]  fluticasone (FLONASE) 50 MCG/ACT nasal spray Place into both nostrils daily.    [provider]  Fluticasone-Salmeterol (ADVAIR) 250-50  MCG/DOSE AEPB Inhale 1 puff into the lungs 2 (two) times daily.    [provider]  ketorolac (TORADOL) 10 MG tablet Take 1 tablet (10 mg total) by mouth every 6 (six) hours as needed. 12/10/17   Enid Derry, PA-C  metFORMIN (GLUCOPHAGE) 500 MG tablet Take 1,000 mg by mouth daily with breakfast.    [provider]  methocarbamol (ROBAXIN) 500 MG tablet Take 500 mg by mouth 3 (three) times daily.    [provider]  montelukast (SINGULAIR) 10 MG tablet Take 10 mg by mouth at bedtime.    [provider]  NON FORMULARY     [provider]  OXYGEN Inhale 2 L into the lungs.    [provider]  OXYQUINOLONE SULFATE VAGINAL (TRIMO-SAN) 0.025 % GEL Place 0.025 mg vaginally 2 (two) times a week. 01/29/16   Hildred Laser, MD  pantoprazole (PROTONIX) 40 MG tablet Take 40 mg by mouth daily.    [provider]  phentermine 37.5 MG capsule Take 37.5 mg by mouth every  morning.    [provider]  predniSONE (DELTASONE) 10 MG tablet Take 10 mg by mouth daily with breakfast.    [provider]  psyllium (METAMUCIL) 58.6 % powder Take 1 packet by mouth 3 (three) times daily.    [provider]  solifenacin (VESICARE) 5 MG tablet Take 1 tablet (5 mg total) by mouth daily. 11/29/16   Alfredo Martinez, MD  SUMAtriptan (IMITREX) 50 MG tablet Take 2 tablets (100 mg total) by mouth once as needed for migraine. May repeat in 2 hours if headache persists or recurs. Do not exceed 200 mg per day. 05/27/15   Cuthriell, Delorise Royals, PA-C  tiotropium (SPIRIVA) 18 MCG inhalation capsule Place 18 mcg into inhaler and inhale daily.    [provider]  traMADol (ULTRAM) 50 MG tablet Take 1 tablet (50 mg total) by mouth every 6 (six) hours as needed. 10/29/16   Triplett, Cari B, FNP  trimethoprim (TRIMPEX) 100 MG tablet Take 1 tablet (100 mg total) by mouth daily. 06/08/16   Alfredo Martinez, MD    Family History Family History  Problem Relation Age of Onset  . Colon cancer Brother 15    Social History Social History   Tobacco Use  . Smoking status: Never Smoker  . Smokeless tobacco: Never Used  Substance Use Topics  . Alcohol use: No  . Drug use: No     Allergies   Penicillins   Review of Systems Review of Systems  Constitutional: Negative for activity change.       All ROS Neg except as noted in HPI  HENT: Negative for nosebleeds.   Eyes: Negative for photophobia and discharge.  Respiratory: Negative for cough, shortness of breath and wheezing.   Cardiovascular: Negative for chest pain and palpitations.  Gastrointestinal: Negative for abdominal pain and blood in stool.  Genitourinary: Negative for dysuria, frequency and hematuria.  Musculoskeletal: Positive for arthralgias. Negative for back pain and neck pain.  Skin: Negative.   Neurological: Negative for dizziness, seizures and speech difficulty.    Psychiatric/Behavioral: Negative for confusion and hallucinations.     Physical Exam Updated Vital Signs BP 134/72 (BP Location: Right Arm)   Pulse 80   Temp 98.2 F (36.8 C) (Temporal)   Resp 14   Ht 5\' 7"  (1.702 m)   Wt (!) 149.7 kg   SpO2 98%   BMI 51.69 kg/m   Physical Exam  Constitutional: She is oriented  to person, place, and time. She appears well-developed and well-nourished.  Non-toxic appearance.  Pt is morbidly obese.  HENT:  Head: Normocephalic.  Right Ear: Tympanic membrane and external ear normal.  Left Ear: Tympanic membrane and external ear normal.  Eyes: Pupils are equal, round, and reactive to light. EOM and lids are normal.  Neck: Normal range of motion. Neck supple. Carotid bruit is not present.  Cardiovascular: Normal rate, regular rhythm, normal heart sounds, intact distal pulses and normal pulses.  Pulmonary/Chest: Breath sounds normal. No respiratory distress.  Abdominal: Soft. Bowel sounds are normal. There is no tenderness. There is no guarding.  Musculoskeletal: Normal range of motion.       Right wrist: She exhibits tenderness.  Contractures noted of the fingers on the right and the left.  Degenerative joint disease changes on the right and the left upper extremity.  Lymphadenopathy:       Head (right side): No submandibular adenopathy present.       Head (left side): No submandibular adenopathy present.    She has no cervical adenopathy.  Neurological: She is alert and oriented to person, place, and time. She has normal strength. No cranial nerve deficit or sensory deficit.  Skin: Skin is warm and dry.  Psychiatric: She has a normal mood and affect. Her speech is normal.  Nursing note and vitals reviewed.    ED Treatments / Results  Labs (all labs ordered are listed, but only abnormal results are displayed) Labs Reviewed - No data to display  EKG None  Radiology Dg Hand Complete Right  Result Date: 02/14/2018 CLINICAL DATA:  Pain  EXAM: RIGHT HAND - COMPLETE 3+ VIEW COMPARISON:  January 19, 2015 FINDINGS: Frontal, oblique, lateral views were obtained. There appears to be chronic flexion at the fifth PIP joint. No fracture or dislocation evident. Joint spaces appear normal. No erosive changes. There is slight bony overgrowth along the lateral distal fourth middle phalanx, a probable anatomic variant. There is a benign-appearing cyst involving the proximal scaphoid bone, a stable finding. IMPRESSION: Apparent chronic flexion fifth PIP joint. No acute fracture or dislocation. No appreciable joint space narrowing or erosion. Benign-appearing cystic area proximal scaphoid bone, stable. Electronically Signed   By: Bretta Bang III M.D.   On: 02/14/2018 19:59    Procedures Procedures (including critical care time)  Medications Ordered in ED Medications - No data to display   Initial Impression / Assessment and Plan / ED Course  I have reviewed the triage vital signs and the nursing notes.  Pertinent labs & imaging results that were available during my care of the patient were reviewed by me and considered in my medical decision making (see chart for details).       Final Clinical Impressions(s) / ED Diagnoses MDM  Vital signs within normal limits.  The patient has good range of motion of the right shoulder, elbow.  There is pain with attempted range of motion of the wrist and extending into the hand.  X-ray is negative for fracture or dislocation or bony lesion.  I suspect that the patient has an exacerbation of arthritis.  Patient fitted with a wrist forearm splint.  The patient is diabetic.  We will ask her to use Tylenol extra strength and give her a prescription for Ultram and diclofenac gel.  The patient is to follow-up with orthopedics concerning her wrist.  Patient is in agreement with this plan.   Final diagnoses:  Right wrist pain  Primary osteoarthritis of right  wrist    ED Discharge Orders          Ordered    meloxicam (MOBIC) 7.5 MG tablet  Daily     02/14/18 2246    traMADol (ULTRAM) 50 MG tablet     02/14/18 2246           Ivery Quale, PA-C 02/15/18 1144    Loren Racer, MD 02/17/18 1538

## 2018-07-24 ENCOUNTER — Ambulatory Visit: Payer: Medicaid Other | Admitting: Urology

## 2018-07-24 ENCOUNTER — Encounter: Payer: Self-pay | Admitting: Urology

## 2018-07-24 DIAGNOSIS — N816 Rectocele: Secondary | ICD-10-CM

## 2018-07-24 DIAGNOSIS — N390 Urinary tract infection, site not specified: Secondary | ICD-10-CM | POA: Diagnosis not present

## 2018-07-24 DIAGNOSIS — N8110 Cystocele, unspecified: Secondary | ICD-10-CM | POA: Diagnosis not present

## 2018-07-24 LAB — URINALYSIS, COMPLETE
BILIRUBIN UA: NEGATIVE
Glucose, UA: NEGATIVE
Ketones, UA: NEGATIVE
Leukocytes, UA: NEGATIVE
Nitrite, UA: NEGATIVE
PH UA: 5.5 (ref 5.0–7.5)
PROTEIN UA: NEGATIVE
RBC UA: NEGATIVE
Specific Gravity, UA: 1.025 (ref 1.005–1.030)
UUROB: 1 mg/dL (ref 0.2–1.0)

## 2018-07-24 LAB — MICROSCOPIC EXAMINATION
Bacteria, UA: NONE SEEN
Epithelial Cells (non renal): NONE SEEN /hpf (ref 0–10)
RBC, UA: NONE SEEN /hpf (ref 0–2)

## 2018-07-24 MED ORDER — TRIMETHOPRIM 100 MG PO TABS
100.0000 mg | ORAL_TABLET | Freq: Every day | ORAL | 11 refills | Status: DC
Start: 2018-07-24 — End: 2019-07-23

## 2018-07-24 MED ORDER — CIPROFLOXACIN HCL 250 MG PO TABS: 250.0000 mg | ORAL_TABLET | Freq: Two times a day (BID) | ORAL | 0 refills | Status: DC

## 2018-07-24 NOTE — Progress Notes (Signed)
07/24/2018 11:30 AM   Sheri PiccoloJulie Ann Carter 04/16/1966 [id number]  Referring provider: Evelene CroonNiemeyer, Meindert, MD [place] [address] [place], [place] [id number]  Chief Complaint  Patient presents with  . Urinary Incontinence    follow up    HPI: She leaks with coughing and sneezing but not bending and lifting. She reports a stress component is most significant. She has mild bedwetting. She can soak 8 pads per day  She voids every 2-3 hours and used a 3 times a night.  She reports 7 bladder infections a year that responded favorably to antibiotics.   By history she is failed Vesicare and Detrol and perhaps other antimuscarinics.   She is morbidly obese and recently went on home oxygen., Based on pulmonary issues and obesity she was not a good surgical candidate.   I would then consider urodynamics. I urethral injectable is another potential option if one was going to treat her outlet. Percutaneous tibial nerve stimulation is another potential option she primarily has an overactive bladder  Pelvic examination was somewhat limited due to obesity and labia. She did have what appeared to be mild grade 2 hypermobility the bladder neck and native cough test. She had no significantprolapse  She underwent cystoscopy after written consent. She had mild cystitis cystica.   The patient has been on trimethoprim hopefully do down regulate her incontinence. The patient reports less incontinence and less frequency. She thought she had 2 infections but when I asked her she really did not have any specifics.  She says she's quite a bit better but her pad count was still high.   Mixed incontinence continues. She can leak trying to get to the restroom.  She was kept on trimethoprim and placed on Vesicare in 2018  Today Patient not on any medication and does not know why she stopped them.  She thought symptoms of cystitis and now she is having a lot of urge incontinence soaking many pads a day.  No fever.   Foul-smelling urine present.  Modifying factors: There are no other modifying factors  Associated signs and symptoms: There are no other associated signs and symptoms Aggravating and relieving factors: There are no other aggravating or relieving factors Severity: Moderate Duration: Persistent   PMH: Past Medical History:  Diagnosis Date  . Asthma   . COPD (chronic obstructive pulmonary disease) (HCC)   . Migraines   . Obesity, morbid, BMI 50 or higher (HCC) 09/25/2015  . Overactive bladder   . Sleep apnea     Surgical History: Past Surgical History:  Procedure Laterality Date  . ABDOMINAL HYSTERECTOMY      Home Medications:  Allergies as of 07/24/2018      Reactions   Penicillins Itching      Medication List       Accurate as of July 24, 2018 11:30 AM. Always use your most recent med list.        albuterol 108 (90 Base) MCG/ACT inhaler Commonly known as:  PROVENTIL HFA;VENTOLIN HFA Inhale into the lungs every 6 (six) hours as needed for wheezing or shortness of breath.   albuterol (2.5 MG/3ML) 0.083% nebulizer solution Commonly known as:  PROVENTIL Take 2.5 mg by nebulization every 6 (six) hours as needed for wheezing or shortness of breath.   aspirin EC 81 MG tablet Take 81 mg by mouth daily.   benzonatate 200 MG capsule Commonly known as:  TESSALON Take 200 mg by mouth 3 (three) times daily as needed for cough.   cyclobenzaprine  5 MG tablet Commonly known as:  FLEXERIL Take 1 tablet (5 mg total) by mouth 3 (three) times daily as needed for muscle spasms.   ergocalciferol 1.25 MG (50000 UT) capsule Commonly known as:  VITAMIN D2 Take 50,000 Units by mouth once a week.   fluticasone 50 MCG/ACT nasal spray Commonly known as:  FLONASE Place into both nostrils daily.   Fluticasone-Salmeterol 250-50 MCG/DOSE Aepb Commonly known as:  ADVAIR Inhale 1 puff into the lungs 2 (two) times daily.   metFORMIN 500 MG tablet Commonly known as:   GLUCOPHAGE Take 1,000 mg by mouth daily with breakfast.   methocarbamol 500 MG tablet Commonly known as:  ROBAXIN Take 500 mg by mouth 3 (three) times daily.   montelukast 10 MG tablet Commonly known as:  SINGULAIR Take 10 mg by mouth at bedtime.   NON FORMULARY   OXYGEN Inhale 2 L into the lungs.   phentermine 37.5 MG capsule Take 37.5 mg by mouth every morning.   predniSONE 10 MG tablet Commonly known as:  DELTASONE Take 10 mg by mouth daily with breakfast.   PROTONIX 40 MG tablet Generic drug:  pantoprazole Take 40 mg by mouth daily.   psyllium 58.6 % powder Commonly known as:  METAMUCIL Take 1 packet by mouth 3 (three) times daily.   solifenacin 5 MG tablet Commonly known as:  VESICARE Take 1 tablet (5 mg total) by mouth daily.   SUMAtriptan 50 MG tablet Commonly known as:  IMITREX Take 2 tablets (100 mg total) by mouth once as needed for migraine. May repeat in 2 hours if headache persists or recurs. Do not exceed 200 mg per day.   tiotropium 18 MCG inhalation capsule Commonly known as:  SPIRIVA Place 18 mcg into inhaler and inhale daily.   traMADol 50 MG tablet Commonly known as:  ULTRAM 1 or 2 po q6h prn pain   trimethoprim 100 MG tablet Commonly known as:  TRIMPEX Take 1 tablet (100 mg total) by mouth daily.       Allergies:  Allergies  Allergen Reactions  . Penicillins Itching    Family History: Family History  Problem Relation Age of Onset  . Colon cancer Brother 55    Social History:  reports that she has never smoked. She has never used smokeless tobacco. She reports that she does not drink alcohol or use drugs.  ROS: UROLOGY Frequent Urination?: Yes Hard to postpone urination?: Yes Burning/pain with urination?: No Get up at night to urinate?: Yes Leakage of urine?: Yes Urine stream starts and stops?: No Trouble starting stream?: No Do you have to strain to urinate?: No Blood in urine?: No Urinary tract infection?:  No Sexually transmitted disease?: No Injury to kidneys or bladder?: No Painful intercourse?: No Weak stream?: No Currently pregnant?: No Vaginal bleeding?: No Last menstrual period?: n  Gastrointestinal Nausea?: No Vomiting?: No Indigestion/heartburn?: No Diarrhea?: No Constipation?: No  Constitutional Fever: No Night sweats?: No Weight loss?: No Fatigue?: No  Skin Skin rash/lesions?: No Itching?: No  Eyes Blurred vision?: No Double vision?: No  Ears/Nose/Throat Sore throat?: No Sinus problems?: No  Hematologic/Lymphatic Swollen glands?: No Easy bruising?: No  Cardiovascular Leg swelling?: No Chest pain?: No  Respiratory Cough?: No Shortness of breath?: No  Endocrine Excessive thirst?: No  Musculoskeletal Back pain?: Yes Joint pain?: Yes  Neurological Headaches?: Yes Dizziness?: No  Psychologic Depression?: No Anxiety?: No  Physical Exam: BP 112/71   Pulse (!) 102   Ht 5\' 8"  (1.727 m)  Wt (!) 334 lb (151.5 kg)   BMI 50.78 kg/m   Constitutional:  Alert and oriented, No acute distress. HEENT: [place] AT, moist mucus membranes.  Trachea midline, no masses.  Laboratory Data: Lab Results  Component Value Date   WBC 10.4 09/18/2016   HGB 14.5 09/18/2016   HCT 42.7 09/18/2016   MCV 87.3 09/18/2016   PLT 205 09/18/2016    Lab Results  Component Value Date   CREATININE 0.78 09/18/2016    No results found for: PSA  No results found for: TESTOSTERONE  No results found for: HGBA1C  Urinalysis    Component Value Date/Time   COLORURINE YELLOW (A) 09/18/2016 2040   APPEARANCEUR Clear 11/29/2016 0840   LABSPEC 1.029 09/18/2016 2040   LABSPEC 1.018 03/11/2013 1944   PHURINE 5.0 09/18/2016 2040   GLUCOSEU Negative 11/29/2016 0840   GLUCOSEU Negative 03/11/2013 1944   HGBUR NEGATIVE 09/18/2016 2040   BILIRUBINUR Negative 11/29/2016 0840   BILIRUBINUR Negative 03/11/2013 1944   KETONESUR NEGATIVE 09/18/2016 2040   PROTEINUR Trace (A)  11/29/2016 0840   PROTEINUR 30 (A) 09/18/2016 2040   UROBILINOGEN negative 09/24/2015 1630   NITRITE Negative 11/29/2016 0840   NITRITE NEGATIVE 09/18/2016 2040   LEUKOCYTESUR Negative 11/29/2016 0840   LEUKOCYTESUR 3+ 03/11/2013 1944    Pertinent Imaging:   Assessment & Plan: Patient will be catheterized if needed but urine will be sent for culture.  I called in ciprofloxacin 250 mg twice a day for 1 week.  She will then started on daily suppression therapy with trimethoprim 100 mg.  I will see if it down regulates her incontinence like it did last time in about 8 weeks  There are no diagnoses linked to this encounter.  No follow-ups on file.  Martina Sinner, MD  [facility] [address], [address] [place], [place] [address] [phone]

## 2018-07-26 LAB — CULTURE, URINE COMPREHENSIVE

## 2018-12-23 ENCOUNTER — Emergency Department
Admission: EM | Admit: 2018-12-23 | Discharge: 2018-12-23 | Disposition: A | Discharge status: Home / Self Care | Payer: Medicaid Other | Attending: Emergency Medicine | Admitting: Emergency Medicine

## 2018-12-23 ENCOUNTER — Encounter: Payer: Self-pay | Admitting: Emergency Medicine

## 2018-12-23 ENCOUNTER — Other Ambulatory Visit: Payer: Self-pay

## 2018-12-23 ENCOUNTER — Emergency Department: Payer: Medicaid Other

## 2018-12-23 DIAGNOSIS — M13861 Other specified arthritis, right knee: Secondary | ICD-10-CM | POA: Insufficient documentation

## 2018-12-23 DIAGNOSIS — Z7982 Long term (current) use of aspirin: Secondary | ICD-10-CM | POA: Insufficient documentation

## 2018-12-23 DIAGNOSIS — M1711 Unilateral primary osteoarthritis, right knee: Secondary | ICD-10-CM

## 2018-12-23 DIAGNOSIS — M25561 Pain in right knee: Secondary | ICD-10-CM

## 2018-12-23 DIAGNOSIS — J449 Chronic obstructive pulmonary disease, unspecified: Secondary | ICD-10-CM | POA: Diagnosis not present

## 2018-12-23 DIAGNOSIS — Z7984 Long term (current) use of oral hypoglycemic drugs: Secondary | ICD-10-CM | POA: Diagnosis not present

## 2018-12-23 DIAGNOSIS — J45909 Unspecified asthma, uncomplicated: Secondary | ICD-10-CM | POA: Insufficient documentation

## 2018-12-23 MED ORDER — MELOXICAM 15 MG PO TABS: 15.0000 mg | ORAL_TABLET | Freq: Every day | ORAL | 0 refills | Status: AC

## 2018-12-23 MED ORDER — OXYCODONE-ACETAMINOPHEN 5-325 MG PO TABS
1.0000 | ORAL_TABLET | ORAL | Status: DC | PRN
  Administered 2018-12-23: 1 via ORAL
  Filled 2018-12-23: qty 1

## 2018-12-23 NOTE — ED Triage Notes (Signed)
R knee pain, states was sitting down and felt pop in knee, painful since.

## 2018-12-23 NOTE — ED Provider Notes (Signed)
[facility] Emergency Department Provider Note  ____________________________________________  Time seen: Approximately 8:04 PM  I have reviewed the triage vital signs and the nursing notes.   HISTORY  Chief Complaint Knee Pain    HPI Sheri PanningJulie Ann Carter is a 53 y.o. female who presents the emergency department complaining of right knee pain.  Patient reports that she has "bad knees" and is been told that she may need a knee replacement.  Patient reports that she was attempting to get out of a low sitting car when she felt a pop and has had sharp knee pain since.  No direct trauma to the knee.  No other injury or complaint.  No medications prior to arrival.         Past Medical History:  Diagnosis Date  . Asthma   . COPD (chronic obstructive pulmonary disease) (HCC)   . Migraines   . Obesity, morbid, BMI 50 or higher (HCC) 09/25/2015  . Overactive bladder   . Sleep apnea     Patient Active Problem List   Diagnosis Date Noted  . Mixed incontinence 05/14/2016  . Cystocele with rectocele 09/25/2015  . Obesity, morbid, BMI 50 or higher (HCC) 09/25/2015  . Continuous leakage of urine 09/25/2015    Past Surgical History:  Procedure Laterality Date  . ABDOMINAL HYSTERECTOMY      Prior to Admission medications   Medication Sig Start Date End Date Taking? Authorizing Provider  albuterol (PROVENTIL HFA;VENTOLIN HFA) 108 (90 Base) MCG/ACT inhaler Inhale into the lungs every 6 (six) hours as needed for wheezing or shortness of breath.    [provider]  albuterol (PROVENTIL) (2.5 MG/3ML) 0.083% nebulizer solution Take 2.5 mg by nebulization every 6 (six) hours as needed for wheezing or shortness of breath.    [provider]  aspirin EC 81 MG tablet Take 81 mg by mouth daily.    [provider]  benzonatate (TESSALON) 200 MG capsule Take 200 mg by mouth 3 (three) times daily as needed for cough.    [provider]   ciprofloxacin (CIPRO) 250 MG tablet Take 1 tablet (250 mg total) by mouth 2 (two) times daily. 07/24/18   Alfredo MartinezMacDiarmid, Scott, MD  cyclobenzaprine (FLEXERIL) 5 MG tablet Take 1 tablet (5 mg total) by mouth 3 (three) times daily as needed for muscle spasms. 02/01/16   Menshew, Charlesetta IvoryJenise V Bacon, PA-C  ergocalciferol (VITAMIN D2) 50000 units capsule Take 50,000 Units by mouth once a week.    [provider]  fluticasone (FLONASE) 50 MCG/ACT nasal spray Place into both nostrils daily.    [provider]  Fluticasone-Salmeterol (ADVAIR) 250-50 MCG/DOSE AEPB Inhale 1 puff into the lungs 2 (two) times daily.    [provider]  meloxicam (MOBIC) 15 MG tablet Take 1 tablet (15 mg total) by mouth daily. 12/23/18   Cobie Leidner, Delorise RoyalsJonathan D, PA-C  metFORMIN (GLUCOPHAGE) 500 MG tablet Take 1,000 mg by mouth daily with breakfast.    [provider]  methocarbamol (ROBAXIN) 500 MG tablet Take 500 mg by mouth 3 (three) times daily.    [provider]  montelukast (SINGULAIR) 10 MG tablet Take 10 mg by mouth at bedtime.    [provider]  NON FORMULARY     [provider]  OXYGEN Inhale 2 L into the lungs.    [provider]  pantoprazole (PROTONIX) 40 MG tablet Take 40 mg by mouth daily.    [provider]  phentermine 37.5 MG capsule  Take 37.5 mg by mouth every morning.    [provider]  predniSONE (DELTASONE) 10 MG tablet Take 10 mg by mouth daily with breakfast.    [provider]  psyllium (METAMUCIL) 58.6 % powder Take 1 packet by mouth 3 (three) times daily.    [provider]  solifenacin (VESICARE) 5 MG tablet Take 1 tablet (5 mg total) by mouth daily. 11/29/16   Alfredo MartinezMacDiarmid, Scott, MD  SUMAtriptan (IMITREX) 50 MG tablet Take 2 tablets (100 mg total) by mouth once as needed for migraine. May repeat in 2 hours if headache persists or recurs. Do not exceed 200 mg per day. 05/27/15   Zaia Carre, Delorise RoyalsJonathan D, PA-C   tiotropium (SPIRIVA) 18 MCG inhalation capsule Place 18 mcg into inhaler and inhale daily.    [provider]  traMADol Janean Sark(ULTRAM) 50 MG tablet 1 or 2 po q6h prn pain 02/14/18   Ivery QualeBryant, Hobson, PA-C  trimethoprim (TRIMPEX) 100 MG tablet Take 1 tablet (100 mg total) by mouth daily. 07/24/18   Alfredo MartinezMacDiarmid, Scott, MD    Allergies Penicillins  Family History  Problem Relation Age of Onset  . Colon cancer Brother 2137    Social History Social History   Tobacco Use  . Smoking status: Never Smoker  . Smokeless tobacco: Never Used  Substance Use Topics  . Alcohol use: No  . Drug use: No     Review of Systems  Constitutional: No fever/chills Eyes: No visual changes. No discharge ENT: No upper respiratory complaints. Cardiovascular: no chest pain. Respiratory: no cough. No SOB. Gastrointestinal: No abdominal pain.  No nausea, no vomiting.  No diarrhea.  No constipation. Musculoskeletal: Positive for right knee pain Skin: Negative for rash, abrasions, lacerations, ecchymosis. Neurological: Negative for headaches, focal weakness or numbness. 10-point ROS otherwise negative.  ____________________________________________   PHYSICAL EXAM:  VITAL SIGNS: ED Triage Vitals  Enc Vitals Group     BP 12/23/18 1733 133/83     Pulse Rate 12/23/18 1733 86     Resp 12/23/18 1733 20     Temp 12/23/18 1733 99.2 F (37.3 C)     Temp Source 12/23/18 1733 Oral     SpO2 12/23/18 1733 96 %     Weight 12/23/18 1734 300 lb (136.1 kg)     Height 12/23/18 1734 5\' 7"  (1.702 m)     Head Circumference --      Peak Flow --      Pain Score 12/23/18 1734 10     Pain Loc --      Pain Edu? --      Excl. in GC? --      Constitutional: Alert and oriented. Well appearing and in no acute distress. Eyes: Conjunctivae are normal. PERRL. EOMI. Head: Atraumatic. ENT:      Ears:       Nose: No congestion/rhinnorhea.      Mouth/Throat: Mucous membranes are moist.  Neck: No stridor.     Cardiovascular: Normal rate, regular rhythm. Normal S1 and S2.  Good peripheral circulation. Respiratory: Normal respiratory effort without tachypnea or retractions. Lungs CTAB. Good air entry to the bases with no decreased or absent breath sounds. Musculoskeletal: Full range of motion to all extremities. No gross deformities appreciated.  No gross deformities noted to the right knee.  No significant ecchymosis, edema.  No overlying skin changes.  Patient is tender to palpation over the patellar ligament with no other significant tenderness to palpation.  No palpable abnormality or deficits.  Varus, valgus,  Lachman's, McMurray's is negative.  Dorsalis pedis pulse intact distally.  Sensation intact distally. Neurologic:  Normal speech and language. No gross focal neurologic deficits are appreciated.  Skin:  Skin is warm, dry and intact. No rash noted. Psychiatric: Mood and affect are normal. Speech and behavior are normal. Patient exhibits appropriate insight and judgement.   ____________________________________________   LABS (all labs ordered are listed, but only abnormal results are displayed)  Labs Reviewed - No data to display ____________________________________________  EKG   ____________________________________________  RADIOLOGY I personally viewed and evaluated these images as part of my medical decision making, as well as reviewing the written report by the radiologist.  Dg Knee Complete 4 Views Right  Result Date: 12/23/2018 CLINICAL DATA:  Pain EXAM: RIGHT KNEE - COMPLETE 4+ VIEW COMPARISON:  August 05, 2018. FINDINGS: There is no acute displaced fracture or dislocation. 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 There is no large joint effusion. IMPRESSION: 1. No acute osseous abnormality. 2. Moderate multicompartmental osteoarthritis. Electronically Signed   By: Constance Holster M.D.   On: 12/23/2018  19:23    ____________________________________________    PROCEDURES  Procedure(s) performed:    Procedures    Medications  oxyCODONE-acetaminophen (PERCOCET/ROXICET) 5-325 MG per tablet 1 tablet (1 tablet Oral Given 12/23/18 1739)     ____________________________________________   INITIAL IMPRESSION / ASSESSMENT AND PLAN / ED COURSE  Pertinent labs & imaging results that were available during my care of the patient were reviewed by me and considered in my medical decision making (see chart for details).  Review of the [place] CSRS was performed in accordance of the [place] prior to dispensing any controlled drugs.           Patient's diagnosis is consistent with tricompartmental arthritis.  Patient presented to the emergency department complaining of right knee pain after attempting to stand up from a low sitting position.  No gross findings on physical exam or x-ray other than tricompartmental arthritis.  Patient will be treated with anti-inflammatories and referred to orthopedics..  Patient is given ED precautions to return to the ED for any worsening or new symptoms.     ____________________________________________  FINAL CLINICAL IMPRESSION(S) / ED DIAGNOSES  Final diagnoses:  Arthritis of knee, right  Acute pain of right knee      NEW MEDICATIONS STARTED DURING THIS VISIT:  ED Discharge Orders         Ordered    meloxicam (MOBIC) 15 MG tablet  Daily     12/23/18 2021              This chart was dictated using voice recognition software/Dragon. Despite best efforts to proofread, errors can occur which can change the meaning. Any change was purely unintentional.    Darletta Moll, PA-C 12/23/18 2021    Vanessa , MD 12/25/18 [phone]

## 2019-07-02 ENCOUNTER — Other Ambulatory Visit: Payer: Self-pay | Admitting: Sports Medicine

## 2019-07-02 DIAGNOSIS — M25461 Effusion, right knee: Secondary | ICD-10-CM

## 2019-07-02 DIAGNOSIS — G8929 Other chronic pain: Secondary | ICD-10-CM

## 2019-07-02 DIAGNOSIS — M1711 Unilateral primary osteoarthritis, right knee: Secondary | ICD-10-CM

## 2019-07-07 ENCOUNTER — Ambulatory Visit: Payer: Medicaid Other

## 2019-07-09 ENCOUNTER — Ambulatory Visit
Admission: RE | Admit: 2019-07-09 | Discharge: 2019-07-09 | Disposition: A | Discharge status: Home / Self Care | Payer: Medicaid Other | Source: Ambulatory Visit | Attending: Sports Medicine | Admitting: Sports Medicine

## 2019-07-09 ENCOUNTER — Other Ambulatory Visit: Payer: Self-pay

## 2019-07-09 DIAGNOSIS — M25461 Effusion, right knee: Secondary | ICD-10-CM | POA: Insufficient documentation

## 2019-07-09 DIAGNOSIS — G8929 Other chronic pain: Secondary | ICD-10-CM | POA: Diagnosis present

## 2019-07-09 DIAGNOSIS — M25561 Pain in right knee: Secondary | ICD-10-CM | POA: Insufficient documentation

## 2019-07-09 DIAGNOSIS — M1711 Unilateral primary osteoarthritis, right knee: Secondary | ICD-10-CM | POA: Insufficient documentation

## 2019-07-23 ENCOUNTER — Ambulatory Visit: Payer: Medicaid Other | Admitting: Urology

## 2019-07-23 ENCOUNTER — Other Ambulatory Visit: Payer: Self-pay

## 2019-07-23 ENCOUNTER — Encounter: Payer: Self-pay | Admitting: Urology

## 2019-07-23 VITALS — BP 108/70 | HR 103 | Ht 67.0 in | Wt 335.0 lb

## 2019-07-23 DIAGNOSIS — N8110 Cystocele, unspecified: Secondary | ICD-10-CM | POA: Diagnosis not present

## 2019-07-23 DIAGNOSIS — N816 Rectocele: Secondary | ICD-10-CM | POA: Diagnosis not present

## 2019-07-23 DIAGNOSIS — N3020 Other chronic cystitis without hematuria: Secondary | ICD-10-CM | POA: Diagnosis not present

## 2019-07-23 DIAGNOSIS — N390 Urinary tract infection, site not specified: Secondary | ICD-10-CM

## 2019-07-23 MED ORDER — TRIMETHOPRIM 100 MG PO TABS: 100.0000 mg | ORAL_TABLET | Freq: Every day | ORAL | 3 refills | Status: AC

## 2019-07-23 NOTE — Progress Notes (Signed)
07/23/2019 1:45 PM   Sheri Carter 1965-10-02 [id number]  Referring provider: [name], MD [address] [place],  [place] [address]  No chief complaint on file.   HPI: She leaks with coughing and sneezing but not bending and lifting. She reports a stress component is most significant. She has mild bedwetting. She can soak 8 pads per day  She voids every 2-3 hours and used a 3 times a night.  She reports 7 bladder infections a year that responded favorably to antibiotics.   By history she is failed Vesicare and Detrol and perhaps other antimuscarinics.   She is morbidly obese and recently went on home oxygen., Based on pulmonary issues and obesity she was not a good surgical candidate.   I would then consider urodynamics. I urethral injectable is another potential option if one was going to treat her outlet. Percutaneous tibial nerve stimulation is another potential option she primarily has an overactive bladder  Pelvic examination was somewhat limited due to obesity and labia. She did have what appeared to be mild grade 2 hypermobility the bladder neck and native cough test. She had no significantprolapse  She underwent cystoscopy after written consent. She had mild cystitis cystica.   The patient has been on trimethoprim hopefully do down regulate her incontinence. The patient reports less incontinence and less frequency. She thought she had 2 infections but when I asked her she really did not have any specifics. She says she's quite a bit better but her pad count was still high.   Mixed incontinence continues. She can leak trying to get to the restroom.  She was kept on trimethoprim and placed on Vesicare in 2018  Today Patient not on any medication and does not know why she stopped them.  She thought symptoms of cystitis and now she is having a lot of urge incontinence soaking many pads a day.  No fever.  Foul-smelling urine present.  Patient will be  catheterized if needed but urine will be sent for culture.  I called in ciprofloxacin 250 mg twice a day for 1 week.  She will then started on daily suppression therapy with trimethoprim 100 mg.  I will see if it down regulates her incontinence like it did last time in about 8 weeks  Today Patient is still on the once a day antibiotic.  She thinks he still getting infection but the only symptom was foul-smelling urine.  I do not think she is active been treated for breakthrough.  He still has incontinence.  Difficulty with even the injectable is it would need to be done under anesthesia because of her obesity.  She has the lung issue noted   PMH: Past Medical History:  Diagnosis Date  . Asthma   . COPD (chronic obstructive pulmonary disease) (HCC)   . Migraines   . Obesity, morbid, BMI 50 or higher (HCC) 09/25/2015  . Overactive bladder   . Sleep apnea     Surgical History: Past Surgical History:  Procedure Laterality Date  . ABDOMINAL HYSTERECTOMY      Home Medications:  Allergies as of 07/23/2019      Reactions   Penicillins Itching      Medication List       Accurate as of July 23, 2019  1:45 PM. If you have any questions, ask your nurse or doctor.        STOP taking these medications   benzonatate 200 MG capsule Commonly known as: TESSALON Stopped by: Lorin Picket  A Charo Philipp, MD   ciprofloxacin 250 MG tablet Commonly known as: Cipro Stopped by: Reece Packer, MD     TAKE these medications   albuterol 108 (90 Base) MCG/ACT inhaler Commonly known as: VENTOLIN HFA Inhale into the lungs every 6 (six) hours as needed for wheezing or shortness of breath. What changed: Another medication with the same name was removed. Continue taking this medication, and follow the directions you see here. Changed by: Reece Packer, MD   albuterol (2.5 MG/3ML) 0.083% nebulizer solution Commonly known as: PROVENTIL Take 2.5 mg by nebulization every 6 (six) hours as needed  for wheezing or shortness of breath. What changed: Another medication with the same name was removed. Continue taking this medication, and follow the directions you see here. Changed by: Reece Packer, MD   aspirin EC 81 MG tablet Take 81 mg by mouth daily. What changed: Another medication with the same name was removed. Continue taking this medication, and follow the directions you see here. Changed by: Reece Packer, MD   atorvastatin 40 MG tablet Commonly known as: LIPITOR Take by mouth.   Cholecalciferol 1.25 MG (50000 UT) capsule Take by mouth.   cyclobenzaprine 5 MG tablet Commonly known as: FLEXERIL Take 1 tablet (5 mg total) by mouth 3 (three) times daily as needed for muscle spasms.   diclofenac Sodium 1 % Gel Commonly known as: VOLTAREN Apply topically.   ergocalciferol 1.25 MG (50000 UT) capsule Commonly known as: VITAMIN D2 Take 50,000 Units by mouth once a week. What changed: Another medication with the same name was removed. Continue taking this medication, and follow the directions you see here. Changed by: Reece Packer, MD   fluticasone 220 MCG/ACT inhaler Commonly known as: FLOVENT HFA Inhale into the lungs.   fluticasone 50 MCG/ACT nasal spray Commonly known as: FLONASE Place into both nostrils daily.   Fluticasone-Salmeterol 250-50 MCG/DOSE Aepb Commonly known as: ADVAIR Inhale 1 puff into the lungs 2 (two) times daily.   furosemide 20 MG tablet Commonly known as: LASIX Take by mouth.   hydrochlorothiazide 25 MG tablet Commonly known as: HYDRODIURIL Take by mouth.   meloxicam 15 MG tablet Commonly known as: MOBIC Take 1 tablet (15 mg total) by mouth daily. What changed: Another medication with the same name was removed. Continue taking this medication, and follow the directions you see here. Changed by: Reece Packer, MD   metFORMIN 500 MG 24 hr tablet Commonly known as: GLUCOPHAGE-XR Take by mouth. What changed:  Another medication with the same name was removed. Continue taking this medication, and follow the directions you see here. Changed by: Reece Packer, MD   methocarbamol 500 MG tablet Commonly known as: ROBAXIN Take 500 mg by mouth 3 (three) times daily.   montelukast 10 MG tablet Commonly known as: SINGULAIR Take 10 mg by mouth at bedtime.   NON FORMULARY   OXYGEN Inhale 2 L into the lungs.   phentermine 37.5 MG capsule Take 37.5 mg by mouth every morning.   potassium chloride 10 MEQ tablet Commonly known as: KLOR-CON Take by mouth.   predniSONE 10 MG tablet Commonly known as: DELTASONE Take 10 mg by mouth daily with breakfast.   Protonix 40 MG tablet Generic drug: pantoprazole Take 40 mg by mouth daily.   psyllium 58.6 % powder Commonly known as: METAMUCIL Take 1 packet by mouth 3 (three) times daily.   solifenacin 5 MG tablet Commonly known as: VESICARE Take 1 tablet (5 mg  total) by mouth daily.   Stiolto Respimat 2.5-2.5 MCG/ACT Aers Generic drug: Tiotropium Bromide-Olodaterol Inhale into the lungs.   SUMAtriptan 50 MG tablet Commonly known as: Imitrex Take 2 tablets (100 mg total) by mouth once as needed for migraine. May repeat in 2 hours if headache persists or recurs. Do not exceed 200 mg per day.   tiotropium 18 MCG inhalation capsule Commonly known as: SPIRIVA Place 18 mcg into inhaler and inhale daily.   traMADol 50 MG tablet Commonly known as: ULTRAM 1 or 2 po q6h prn pain   trimethoprim 100 MG tablet Commonly known as: TRIMPEX Take 1 tablet (100 mg total) by mouth daily.       Allergies:  Allergies  Allergen Reactions  . Penicillins Itching    Family History: Family History  Problem Relation Age of Onset  . Colon cancer Brother 53    Social History:  reports that she has never smoked. She has never used smokeless tobacco. She reports that she does not drink alcohol or use drugs.  ROS:                                         Physical Exam: BP 108/70   Pulse (!) 103   Ht 5\' 7"  (1.702 m)   Wt (!) 335 lb (152 kg)   BMI 52.47 kg/m     Laboratory Data: Lab Results  Component Value Date   WBC 10.4 09/18/2016   HGB 14.5 09/18/2016   HCT 42.7 09/18/2016   MCV 87.3 09/18/2016   PLT 205 09/18/2016    Lab Results  Component Value Date   CREATININE 0.78 09/18/2016    No results found for: PSA  No results found for: TESTOSTERONE  No results found for: HGBA1C  Urinalysis    Component Value Date/Time   COLORURINE YELLOW (A) 09/18/2016 2040   APPEARANCEUR Cloudy (A) 07/24/2018 1144   LABSPEC 1.029 09/18/2016 2040   LABSPEC 1.018 03/11/2013 1944   PHURINE 5.0 09/18/2016 2040   GLUCOSEU Negative 07/24/2018 1144   GLUCOSEU Negative 03/11/2013 1944   HGBUR NEGATIVE 09/18/2016 2040   BILIRUBINUR Negative 07/24/2018 1144   BILIRUBINUR Negative 03/11/2013 1944   KETONESUR NEGATIVE 09/18/2016 2040   PROTEINUR Negative 07/24/2018 1144   PROTEINUR 30 (A) 09/18/2016 2040   UROBILINOGEN negative 09/24/2015 1630   NITRITE Negative 07/24/2018 1144   NITRITE NEGATIVE 09/18/2016 2040   LEUKOCYTESUR Negative 07/24/2018 1144   LEUKOCYTESUR 3+ 03/11/2013 1944    Pertinent Imaging:   Assessment & Plan: Patient has ongoing incontinence.  She is on suppression therapy for recurrent bladder infections.  90x3 sent to pharmacy.  Patient will bring in urine culture and we will call if positive.  Otherwise see near  There are no diagnoses linked to this encounter.  No follow-ups on file.  05/11/2013, MD  [facility] [address] [place], [place] [phone]

## 2020-02-12 IMAGING — CR RIGHT KNEE - COMPLETE 4+ VIEW
4 series · 4 of 4 positions shown · non-contrast
Comparison: August 05, 2018.

CLINICAL DATA: Pain

EXAM:
RIGHT KNEE - COMPLETE 4+ VIEW

[knee ap]
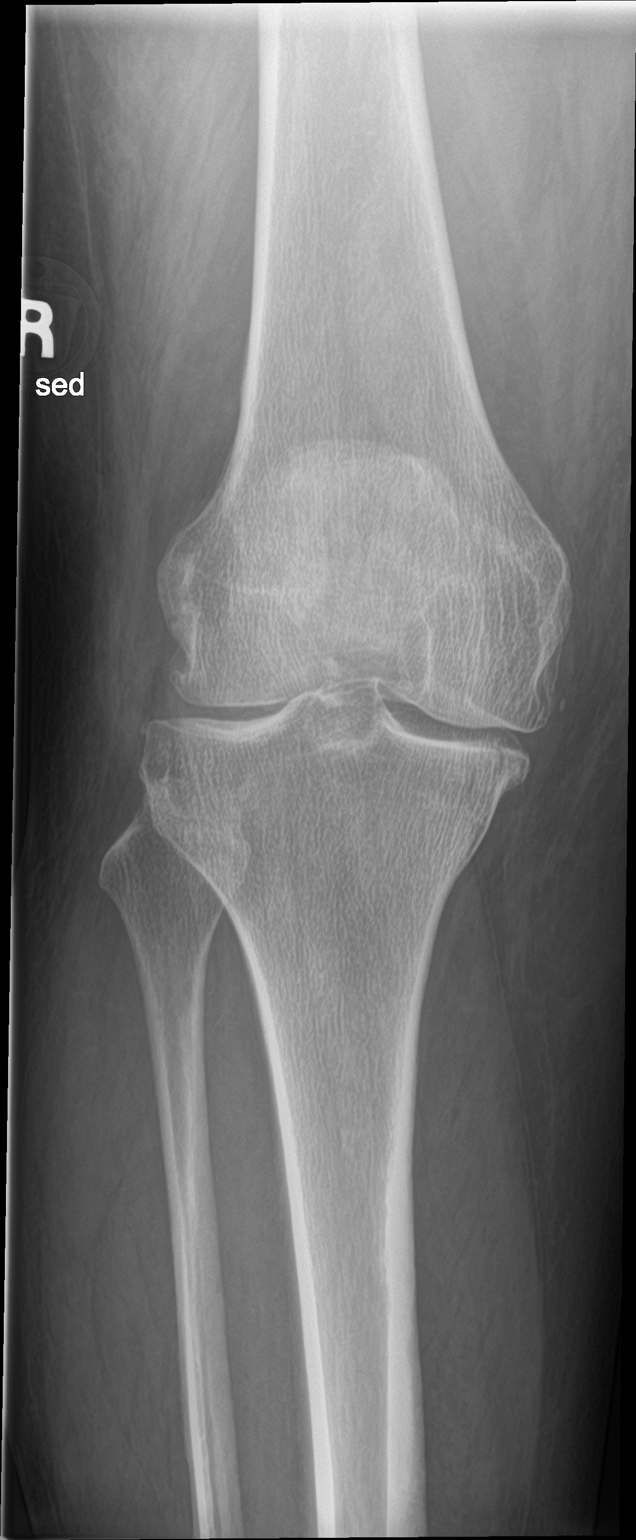

[knee obl (1 of 2)]
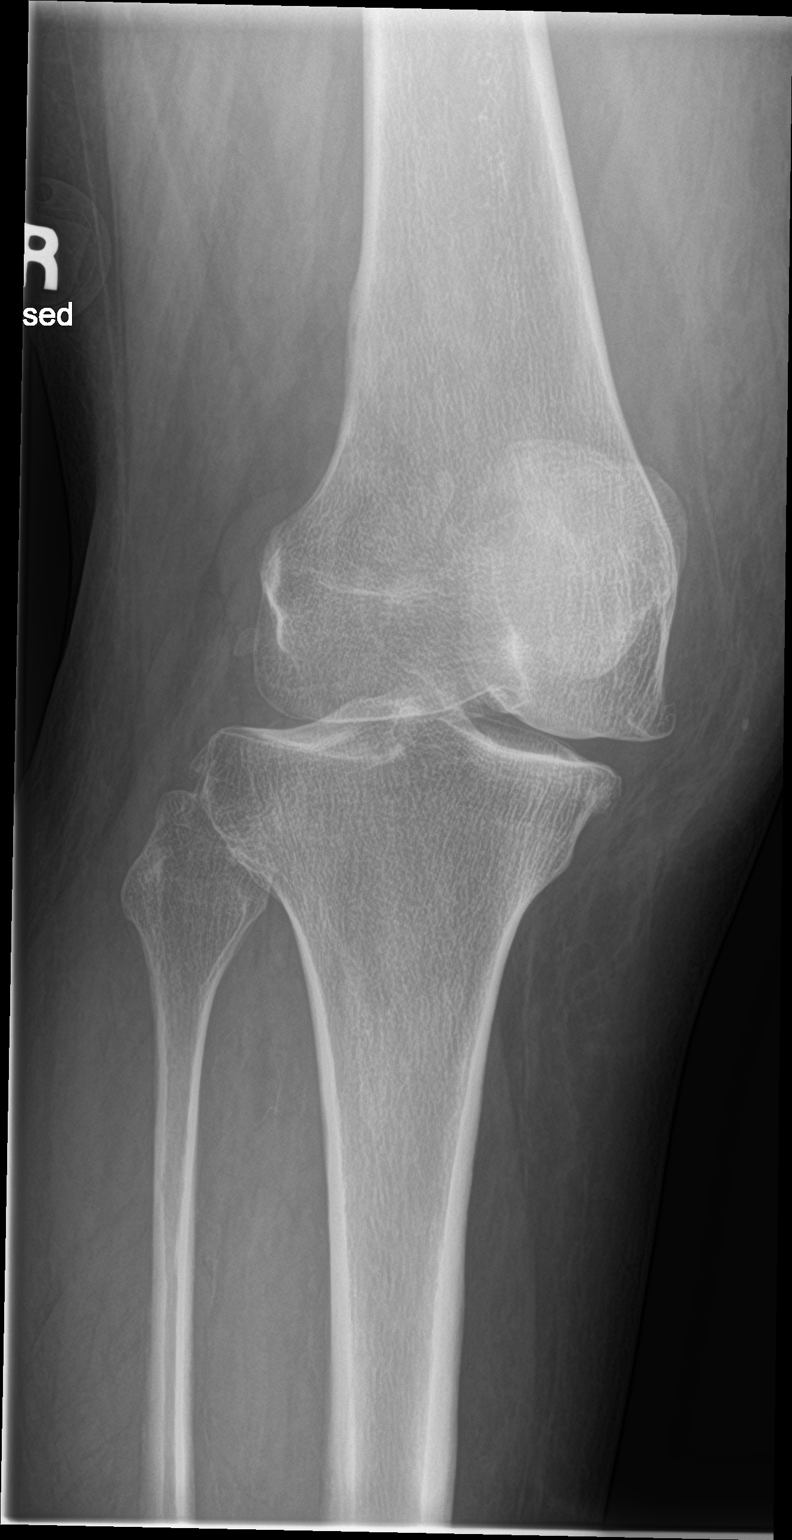

[knee obl (2 of 2)]
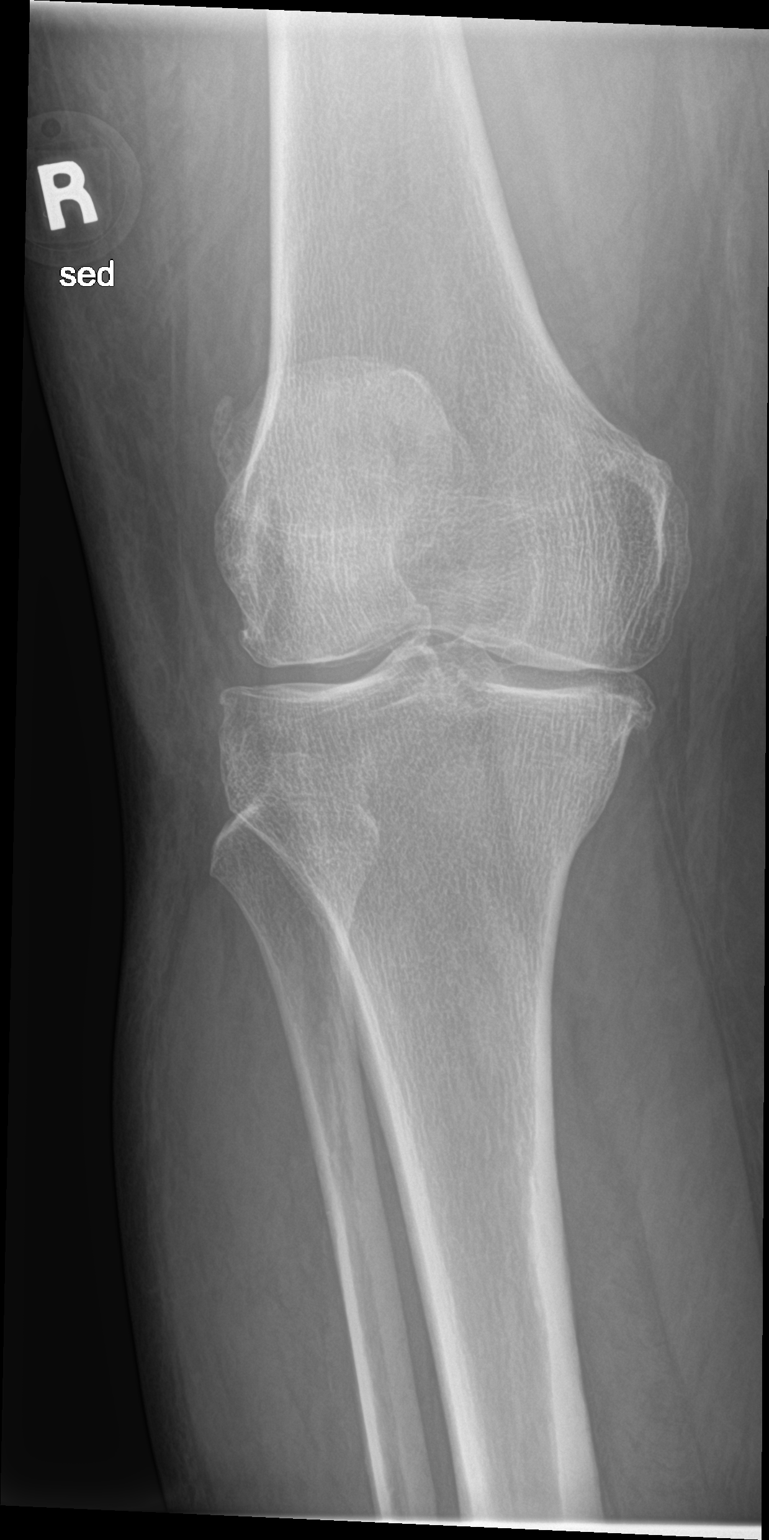

[knee lat]
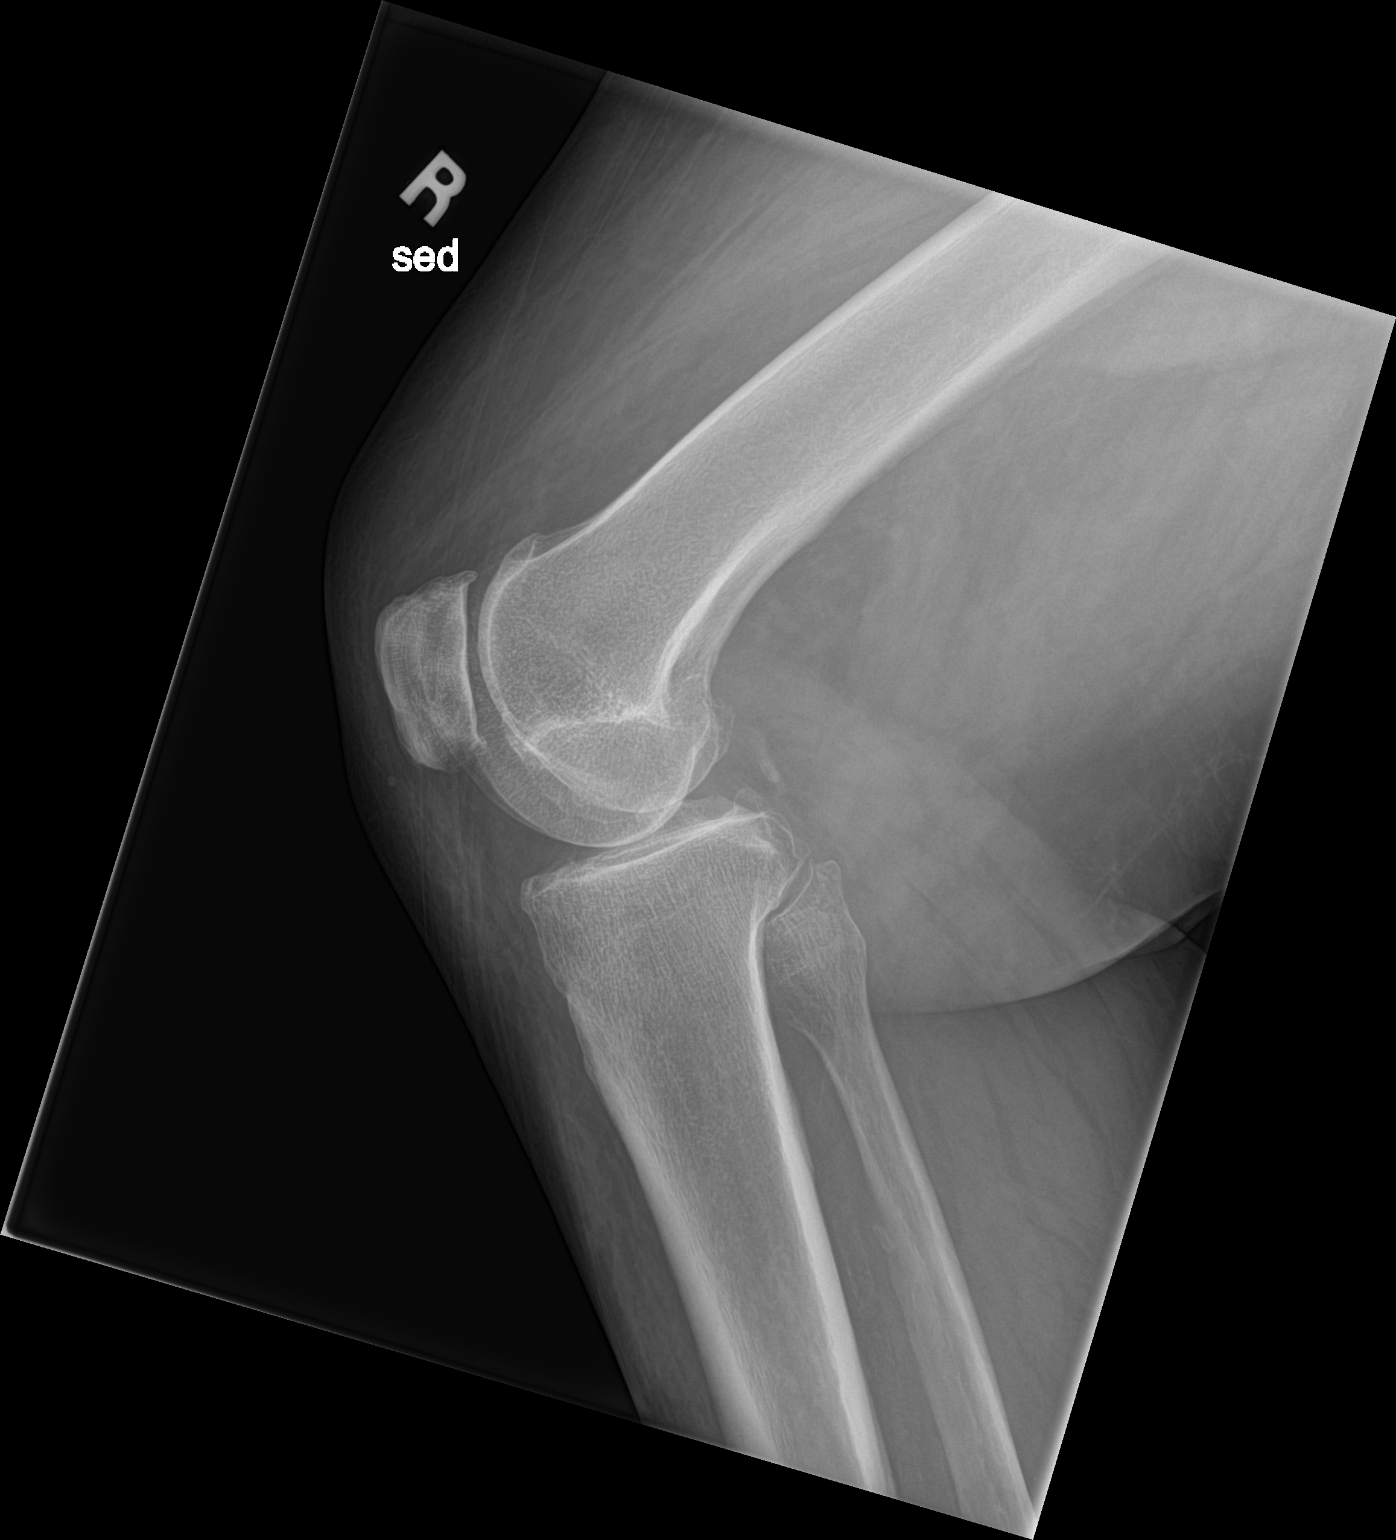

[4 of 4 positions shown; findings below may reference images not displayed]

FINDINGS: There is no acute displaced fracture or dislocation. There is
moderate multicompartmental osteoarthritis, greatest within the
medial and patellofemoral compartments. There is no large joint
effusion.
IMPRESSION: 1. No acute osseous abnormality.
2. Moderate multicompartmental osteoarthritis.

## 2020-03-03 ENCOUNTER — Encounter: Payer: Self-pay | Admitting: Emergency Medicine

## 2020-03-03 ENCOUNTER — Other Ambulatory Visit: Payer: Self-pay

## 2020-03-03 DIAGNOSIS — B379 Candidiasis, unspecified: Secondary | ICD-10-CM | POA: Insufficient documentation

## 2020-03-03 DIAGNOSIS — E119 Type 2 diabetes mellitus without complications: Secondary | ICD-10-CM | POA: Insufficient documentation

## 2020-03-03 DIAGNOSIS — R1110 Vomiting, unspecified: Secondary | ICD-10-CM | POA: Diagnosis not present

## 2020-03-03 DIAGNOSIS — K5900 Constipation, unspecified: Secondary | ICD-10-CM | POA: Insufficient documentation

## 2020-03-03 DIAGNOSIS — Z7982 Long term (current) use of aspirin: Secondary | ICD-10-CM | POA: Diagnosis not present

## 2020-03-03 DIAGNOSIS — Z7984 Long term (current) use of oral hypoglycemic drugs: Secondary | ICD-10-CM | POA: Diagnosis not present

## 2020-03-03 DIAGNOSIS — J449 Chronic obstructive pulmonary disease, unspecified: Secondary | ICD-10-CM | POA: Diagnosis not present

## 2020-03-03 DIAGNOSIS — Z79899 Other long term (current) drug therapy: Secondary | ICD-10-CM | POA: Diagnosis not present

## 2020-03-03 DIAGNOSIS — R109 Unspecified abdominal pain: Secondary | ICD-10-CM | POA: Diagnosis present

## 2020-03-03 DIAGNOSIS — Z7951 Long term (current) use of inhaled steroids: Secondary | ICD-10-CM | POA: Insufficient documentation

## 2020-03-03 LAB — COMPREHENSIVE METABOLIC PANEL
ALT: 34 U/L (ref 0–44)
AST: 31 U/L (ref 15–41)
Albumin: 3.8 g/dL (ref 3.5–5.0)
Alkaline Phosphatase: 104 U/L (ref 38–126)
Anion gap: 13 (ref 5–15)
BUN: 11 mg/dL (ref 6–20)
CO2: 22 mmol/L (ref 22–32)
Calcium: 9.1 mg/dL (ref 8.9–10.3)
Chloride: 92 mmol/L — ABNORMAL LOW (ref 98–111)
Creatinine, Ser: 0.81 mg/dL (ref 0.44–1.00)
GFR calc Af Amer: >60 mL/min (ref >60)
GFR calc non Af Amer: >60 mL/min (ref >60)
Glucose, Bld: 686 mg/dL (ref 70–99)
Potassium: 3.7 mmol/L (ref 3.5–5.1)
Sodium: 127 mmol/L — ABNORMAL LOW (ref 135–145)
Total Bilirubin: 1.5 mg/dL — ABNORMAL HIGH (ref 0.3–1.2)
Total Protein: 7.7 g/dL (ref 6.5–8.1)

## 2020-03-03 LAB — CBC
HCT: 43.6 % (ref 36.0–46.0)
Hemoglobin: 15.7 g/dL — ABNORMAL HIGH (ref 12.0–15.0)
MCH: 30.5 pg (ref 26.0–34.0)
MCHC: 36 g/dL (ref 30.0–36.0)
MCV: 84.8 fL (ref 80.0–100.0)
Platelets: 190 10*3/uL (ref 150–400)
RBC: 5.14 MIL/uL — ABNORMAL HIGH (ref 3.87–5.11)
RDW: 12.8 % (ref 11.5–15.5)
WBC: 8.6 10*3/uL (ref 4.0–10.5)
nRBC: 0 % (ref 0.0–0.2)

## 2020-03-03 LAB — URINALYSIS, COMPLETE (UACMP) WITH MICROSCOPIC
Bacteria, UA: NONE SEEN
Bilirubin Urine: NEGATIVE
Glucose, UA: >=500 mg/dL — AB
Hgb urine dipstick: NEGATIVE
Ketones, ur: NEGATIVE mg/dL
Leukocytes,Ua: NEGATIVE
Nitrite: NEGATIVE
Protein, ur: NEGATIVE mg/dL
Specific Gravity, Urine: 1.03 (ref 1.005–1.030)
pH: 5 (ref 5.0–8.0)

## 2020-03-03 LAB — LIPASE, BLOOD: Lipase: 38 U/L (ref 11–51)

## 2020-03-03 NOTE — ED Triage Notes (Addendum)
Pt presents to ED via POV with c/o urination/bowel movement/yeast discharge from her belly button. Pt denies pain, c/o lower abdominal pain at this time due to constipation, states has not had a bowel movement in 2 days. Pt states noted urination/defacation/yeast infection from her belly button and from vagina/rectum.   Upon inspected by this RN, no obvious fistula noted by this RN. Pt with white discharge noted to midline umbilical area. Pt does c/o pain due to feeling like she needs to have a BM, states ate corn 2 weeks ago and has not passed it.

## 2020-03-04 ENCOUNTER — Emergency Department: Payer: Medicaid Other

## 2020-03-04 ENCOUNTER — Emergency Department
Admission: EM | Admit: 2020-03-04 | Discharge: 2020-03-04 | Disposition: A | Discharge status: Home / Self Care | Payer: Medicaid Other | Attending: Emergency Medicine | Admitting: Emergency Medicine

## 2020-03-04 DIAGNOSIS — B379 Candidiasis, unspecified: Secondary | ICD-10-CM

## 2020-03-04 DIAGNOSIS — K5900 Constipation, unspecified: Secondary | ICD-10-CM

## 2020-03-04 LAB — BASIC METABOLIC PANEL
Anion gap: 14 (ref 5–15)
BUN: 9 mg/dL (ref 6–20)
CO2: 24 mmol/L (ref 22–32)
Calcium: 9.2 mg/dL (ref 8.9–10.3)
Chloride: 94 mmol/L — ABNORMAL LOW (ref 98–111)
Creatinine, Ser: 0.62 mg/dL (ref 0.44–1.00)
GFR calc Af Amer: >60 mL/min (ref >60)
GFR calc non Af Amer: >60 mL/min (ref >60)
Glucose, Bld: 351 mg/dL — ABNORMAL HIGH (ref 70–99)
Potassium: 3.8 mmol/L (ref 3.5–5.1)
Sodium: 132 mmol/L — ABNORMAL LOW (ref 135–145)

## 2020-03-04 LAB — WET PREP, GENITAL
Clue Cells Wet Prep HPF POC: NONE SEEN
Sperm: NONE SEEN
Trich, Wet Prep: NONE SEEN

## 2020-03-04 LAB — GLUCOSE, CAPILLARY: Glucose-Capillary: 341 mg/dL — ABNORMAL HIGH (ref 70–99)

## 2020-03-04 LAB — CHLAMYDIA/NGC RT PCR (ARMC ONLY)
Chlamydia Tr: NOT DETECTED
N gonorrhoeae: NOT DETECTED

## 2020-03-04 MED ORDER — IOHEXOL 350 MG/ML SOLN
100.0000 mL | Freq: Once | INTRAVENOUS | Status: AC | PRN
  Administered 2020-03-04: 100 mL via INTRAVENOUS

## 2020-03-04 MED ORDER — CLOTRIMAZOLE 1 % EX CREA: 1.0000 "application " | TOPICAL_CREAM | Freq: Two times a day (BID) | CUTANEOUS | 0 refills | Status: AC

## 2020-03-04 MED ORDER — FLUCONAZOLE 150 MG PO TABS: 150.0000 mg | ORAL_TABLET | Freq: Once | ORAL | 0 refills | Status: AC

## 2020-03-04 MED ORDER — IOHEXOL 9 MG/ML PO SOLN
500.0000 mL | ORAL | Status: AC
  Administered 2020-03-04: 1000 mL via ORAL

## 2020-03-04 MED ORDER — POLYETHYLENE GLYCOL 3350 17 GM/SCOOP PO POWD: 17.0000 g | Freq: Every day | ORAL | 0 refills | Status: AC

## 2020-03-04 NOTE — Discharge Instructions (Addendum)
Take the Diflucan tablet once to help with the vaginal yeast infection.  You should use the clotrimazole cream for the next week both on the area around her bellybutton and surgical scar, as well as vaginally.  You should take the MiraLAX powder daily for the next week to help with constipation.  Return to the ER for new, worsening, or persistent severe constipation, abdominal pain, liquid or discharge coming from the bellybutton, fever or chills, or any other new or worsening symptoms that concern you.

## 2020-03-04 NOTE — ED Notes (Signed)
Assumed care of pt, awaiting CT results. Pt using cell phone in room. Denies further drainage from umbilicus. AOx4.  Pt resting comfortably with regular and unlabored breathing. Denies needs or concerns. AOx4. Side rail up x2. Call bell within reach, bed lowered.

## 2020-03-04 NOTE — ED Provider Notes (Signed)
[facility] Emergency Department Provider Note ____________________________________________   First MD Initiated Contact with Patient 03/04/20 [phone]     (approximate)  I have reviewed the triage vital signs and the nursing notes.   HISTORY  Chief Complaint Urination out of Belly Button    HPI Sheri Carter is a 54 y.o. female with PMH as noted below including diabetes and COPD who presents with a primary complaint of liquid coming out of her umbilicus over the last 3 weeks.  She believes that this is urine, and states that when she strains to urinate or have a bowel movement, liquid that appears to be urine comes out of her umbilicus.  She also reports having a yeast infection to the area.  The triage note reported defecation from the umbilicus as well, however the patient states that it is liquid that comes out when she strains to have a bowel movement rather than actual stool coming out.  She also reports constipation and states that she ate corn 2 weeks ago that still has not come out, and feels some pressure in her rectum.  She states that she has somewhat greenish, liquid stools.  She also incidentally reports some vomiting recently, but denies any active nausea at this time.  She had an abdominal hysterectomy more than 25 years ago, but denies other surgeries.  Past Medical History:  Diagnosis Date  . Asthma   . COPD (chronic obstructive pulmonary disease) (HCC)   . Migraines   . Obesity, morbid, BMI 50 or higher (HCC) 09/25/2015  . Overactive bladder   . Sleep apnea     Patient Active Problem List   Diagnosis Date Noted  . Mixed incontinence 05/14/2016  . Cystocele with rectocele 09/25/2015  . Obesity, morbid, BMI 50 or higher (HCC) 09/25/2015  . Continuous leakage of urine 09/25/2015    Past Surgical History:  Procedure Laterality Date  . ABDOMINAL HYSTERECTOMY      Prior to Admission medications   Medication Sig Start Date End Date  Taking? Authorizing Provider  albuterol (PROVENTIL HFA;VENTOLIN HFA) 108 (90 Base) MCG/ACT inhaler Inhale into the lungs every 6 (six) hours as needed for wheezing or shortness of breath.    [provider]  albuterol (PROVENTIL) (2.5 MG/3ML) 0.083% nebulizer solution Take 2.5 mg by nebulization every 6 (six) hours as needed for wheezing or shortness of breath.    [provider]  aspirin EC 81 MG tablet Take 81 mg by mouth daily.    [provider]  atorvastatin (LIPITOR) 40 MG tablet Take by mouth.    [provider]  Cholecalciferol 1.25 MG (50000 UT) capsule Take by mouth.    [provider]  clotrimazole (CLOTRIMAZOLE ANTI-FUNGAL) 1 % cream Apply 1 application topically 2 (two) times daily. 03/04/20   Dionne BucySiadecki, Lavada Langsam, MD  cyclobenzaprine (FLEXERIL) 5 MG tablet Take 1 tablet (5 mg total) by mouth 3 (three) times daily as needed for muscle spasms. 02/01/16   Menshew, Charlesetta IvoryJenise V Bacon, PA-C  diclofenac Sodium (VOLTAREN) 1 % GEL Apply topically. 06/28/19 06/27/20  [provider]  ergocalciferol (VITAMIN D2) 50000 units capsule Take 50,000 Units by mouth once a week.    [provider]  fluconazole (DIFLUCAN) 150 MG tablet Take 1 tablet (150 mg total) by mouth once for 1 dose. 03/04/20 03/04/20  Dionne BucySiadecki, Rilynn Habel, MD  fluticasone (FLONASE) 50 MCG/ACT nasal spray Place into both nostrils daily.    [provider]  fluticasone (FLOVENT HFA) 220 MCG/ACT  inhaler Inhale into the lungs.    [provider]  Fluticasone-Salmeterol (ADVAIR) 250-50 MCG/DOSE AEPB Inhale 1 puff into the lungs 2 (two) times daily.    [provider]  furosemide (LASIX) 20 MG tablet Take by mouth.    [provider]  hydrochlorothiazide (HYDRODIURIL) 25 MG tablet Take by mouth. 02/07/19   [provider]  meloxicam (MOBIC) 15 MG tablet Take 1 tablet (15 mg total) by mouth daily. 12/23/18   Cuthriell, Delorise Royals, PA-C  metFORMIN  (GLUCOPHAGE-XR) 500 MG 24 hr tablet Take by mouth.    [provider]  methocarbamol (ROBAXIN) 500 MG tablet Take 500 mg by mouth 3 (three) times daily.    [provider]  montelukast (SINGULAIR) 10 MG tablet Take 10 mg by mouth at bedtime.    [provider]  NON FORMULARY     [provider]  OXYGEN Inhale 2 L into the lungs.    [provider]  pantoprazole (PROTONIX) 40 MG tablet Take 40 mg by mouth daily.    [provider]  phentermine 37.5 MG capsule Take 37.5 mg by mouth every morning.    [provider]  polyethylene glycol powder (MIRALAX) 17 GM/SCOOP powder Take 17 g by mouth daily for 7 doses. 03/04/20 03/11/20  Dionne Bucy, MD  potassium chloride (KLOR-CON) 10 MEQ tablet Take by mouth.    [provider]  predniSONE (DELTASONE) 10 MG tablet Take 10 mg by mouth daily with breakfast.    [provider]  psyllium (METAMUCIL) 58.6 % powder Take 1 packet by mouth 3 (three) times daily.    [provider]  solifenacin (VESICARE) 5 MG tablet Take 1 tablet (5 mg total) by mouth daily. 11/29/16   Alfredo Martinez, MD  SUMAtriptan (IMITREX) 50 MG tablet Take 2 tablets (100 mg total) by mouth once as needed for migraine. May repeat in 2 hours if headache persists or recurs. Do not exceed 200 mg per day. 05/27/15   Cuthriell, Delorise Royals, PA-C  tiotropium (SPIRIVA) 18 MCG inhalation capsule Place 18 mcg into inhaler and inhale daily.    [provider]  Tiotropium Bromide-Olodaterol (STIOLTO RESPIMAT) 2.5-2.5 MCG/ACT AERS Inhale into the lungs.    [provider]  traMADol Janean Sark) 50 MG tablet 1 or 2 po q6h prn pain 02/14/18   Ivery Quale, PA-C  trimethoprim (TRIMPEX) 100 MG tablet Take 1 tablet (100 mg total) by mouth daily. 07/23/19   Alfredo Martinez, MD    Allergies Morphine and Penicillins  Family History  Problem Relation Age of Onset  . Colon cancer Brother 95     Social History Social History   Tobacco Use  . Smoking status: Never Smoker  . Smokeless tobacco: Never Used  Substance Use Topics  . Alcohol use: No  . Drug use: No    Review of Systems  Constitutional: No fever. Eyes: No redness. ENT: No sore throat. Cardiovascular: Denies chest pain. Respiratory: Denies shortness of breath. Gastrointestinal: Positive for vomiting and constipation. Genitourinary: Negative for dysuria.  Musculoskeletal: Negative for back pain. Skin: Negative for rash. Neurological: Negative for headache.   ____________________________________________   PHYSICAL EXAM:  VITAL SIGNS: ED Triage Vitals [03/03/20 1452]  Enc Vitals Group     BP (!) 147/91     Pulse Rate 91     Resp 20     Temp 98.9 F (37.2 C)     Temp Source Oral     SpO2 98 %  Weight 299 lb (135.6 kg)     Height 5\' 7"  (1.702 m)     Head Circumference      Peak Flow      Pain Score 9     Pain Loc      Pain Edu?      Excl. in GC?     Constitutional: Alert and oriented.  Relatively comfortable appearing, in no acute distress. Eyes: Conjunctivae are normal.  No scleral icterus. Head: Atraumatic. Nose: No congestion/rhinnorhea. Mouth/Throat: Mucous membranes are moist.   Neck: Normal range of motion.  Cardiovascular: Normal rate, regular rhythm.  Good peripheral circulation. Respiratory: Normal respiratory effort.  No retractions.  Gastrointestinal: Soft with mild mid abdominal tenderness.  No distention. Lower abdominal midline scar clean, dry, intact.  No fluid present in or expressed from the umbilicus or old scar.  Faint erythema and whitish scaly appearance around the scar/fat pannus.  No discharge.  No impacted stool on DRE.  Genitourinary: No flank tenderness. Musculoskeletal: No lower extremity edema.  Extremities warm and well perfused.  Neurologic:  Normal speech and language. No gross focal neurologic deficits are appreciated.  Skin:  Skin is warm and dry.   Psychiatric: Mood and affect are normal. Speech and behavior are normal.  ____________________________________________   LABS (all labs ordered are listed, but only abnormal results are displayed)  Labs Reviewed  WET PREP, GENITAL - Abnormal; Notable for the following components:      Result Value   Yeast Wet Prep HPF POC PRESENT (*)    WBC, Wet Prep HPF POC MODERATE (*)    All other components within normal limits  COMPREHENSIVE METABOLIC PANEL - Abnormal; Notable for the following components:   Sodium 127 (*)    Chloride 92 (*)    Glucose, Bld 686 (*)    Total Bilirubin 1.5 (*)    All other components within normal limits  CBC - Abnormal; Notable for the following components:   RBC 5.14 (*)    Hemoglobin 15.7 (*)    All other components within normal limits  URINALYSIS, COMPLETE (UACMP) WITH MICROSCOPIC - Abnormal; Notable for the following components:   Color, Urine STRAW (*)    APPearance CLEAR (*)    Glucose, UA >=500 (*)    All other components within normal limits  GLUCOSE, CAPILLARY - Abnormal; Notable for the following components:   Glucose-Capillary 341 (*)    All other components within normal limits  BASIC METABOLIC PANEL - Abnormal; Notable for the following components:   Sodium 132 (*)    Chloride 94 (*)    Glucose, Bld 351 (*)    All other components within normal limits  CHLAMYDIA/NGC RT PCR ([facility] ONLY)  LIPASE, BLOOD   ____________________________________________  EKG   ____________________________________________  RADIOLOGY  CT abdomen/pelvis: No umbilical or other acute abnormality.  Moderate colonic stool burden with no evidence of obstruction.  ____________________________________________   PROCEDURES  Procedure(s) performed: No  Procedures  Critical Care performed: No ____________________________________________   INITIAL IMPRESSION / ASSESSMENT AND PLAN / ED COURSE  Pertinent labs & imaging results that were available  during my care of the patient were reviewed by me and considered in my medical decision making (see chart for details).  54 year old female with PMH as noted above including diabetes (on oral medications) and COPD presents with concern for liquid discharge from her umbilicusshe believes is urine.  She also reports constipation and some vomiting.  Of note, due to the ongoing COVID-19  pandemic and associated ED volume and staffing issues, the patient waited for over 10 hours to be seen.  I reviewed the past medical records in Epic.  The patient's most recent ED visit was in 2020 for unrelated symptoms.  She has no history of abdominal surgeries other than a hysterectomy more than 20 years ago.  On exam she is overall comfortable appearing.  Her vital signs are normal.  Physical exam reveals some mild mid abdominal tenderness, and there are findings consistent with mild candidal intertrigo type infection to the lower abdominal scar, but the whole area appears dry and intact, and I am unable to express any liquid from the umbilicus or scar.  Overall I suspect that the patient is having discharge from the umbilicus and likely fungal infection, rather than urine, however differential also includes some type of fistula.  In addition, the patient is significantly hyperglycemic although with a normal anion gap and no evidence of DKA.  Glucose improved from 686-341 while the patient was waiting to be seen.  We will obtain a repeat BMP, CT abdomen, and reassess.  ----------------------------------------- 5:46 AM on 03/04/2020 -----------------------------------------  Repeat BMP shows improved glucose and is otherwise normal.  CT shows no acute abnormalities.  The patient does have a moderate stool burden consistent with constipation.  There is no evidence of any fistula or other abnormality involving the umbilicus.  I performed a vaginal exam and send a wet prep.  There is yeast present, and the exam is  demonstrates discharge consistent with yeast.  Overall I still suspect a fungal skin infection as the source of the patient's clear discharge from the umbilicus, in addition to the vaginal yeast infection.  I will treat with oral Diflucan as well as topical clotrimazole for both sites, and MiraLAX for constipation.  I counseled the patient on the results of the work-up and the plan of care.  At this time, she is stable for discharge home.  I gave her thorough return precautions and she expressed understanding.  ____________________________________________   FINAL CLINICAL IMPRESSION(S) / ED DIAGNOSES  Final diagnoses:  Yeast infection  Constipation, unspecified constipation type      NEW MEDICATIONS STARTED DURING THIS VISIT:  New Prescriptions   CLOTRIMAZOLE (CLOTRIMAZOLE ANTI-FUNGAL) 1 % CREAM    Apply 1 application topically 2 (two) times daily.   FLUCONAZOLE (DIFLUCAN) 150 MG TABLET    Take 1 tablet (150 mg total) by mouth once for 1 dose.   POLYETHYLENE GLYCOL POWDER (MIRALAX) 17 GM/SCOOP POWDER    Take 17 g by mouth daily for 7 doses.     Note:  This document was prepared using Dragon voice recognition software and may include unintentional dictation errors.    Dionne Bucy, MD 03/04/20 [phone]

## 2020-03-04 NOTE — ED Notes (Signed)
DC reveiwed by provider and RN, pt spouse driving her home, pt ambulated out of ED independently, ao x4

## 2020-03-04 NOTE — ED Notes (Signed)
See triage note. Pt states "I am peeing out of my belly button, when I get a yeast infection this happens".  No discharge noted to belly button. Rash noted to lower abdominal area.

## 2020-03-04 NOTE — ED Notes (Signed)
RN assisted Dr. Marisa Severin during pelvic exam, specimens collected and sent to lab.
